# Patient Record
Sex: Female | Born: 1965 | Race: White | Hispanic: No | Marital: Married | Smoking: Never smoker
Health system: Southern US, Community
[De-identification: ages and names within clinical notes are randomized; demographics above are authoritative.]

## PROBLEM LIST (undated history)

## (undated) DIAGNOSIS — I82409 Acute embolism and thrombosis of unspecified deep veins of unspecified lower extremity: Secondary | ICD-10-CM

## (undated) DIAGNOSIS — C801 Malignant (primary) neoplasm, unspecified: Secondary | ICD-10-CM

## (undated) DIAGNOSIS — K449 Diaphragmatic hernia without obstruction or gangrene: Secondary | ICD-10-CM

## (undated) DIAGNOSIS — O2230 Deep phlebothrombosis in pregnancy, unspecified trimester: Secondary | ICD-10-CM

## (undated) DIAGNOSIS — T7840XA Allergy, unspecified, initial encounter: Secondary | ICD-10-CM

## (undated) DIAGNOSIS — D6851 Activated protein C resistance: Secondary | ICD-10-CM

## (undated) HISTORY — DX: Deep phlebothrombosis in pregnancy, unspecified trimester: O22.30

## (undated) HISTORY — DX: Allergy, unspecified, initial encounter: T78.40XA

## (undated) HISTORY — PX: VEIN SURGERY: SHX48

## (undated) HISTORY — PX: TYMPANOPLASTY: SHX33

## (undated) HISTORY — DX: Activated protein C resistance: D68.51

## (undated) HISTORY — DX: Malignant (primary) neoplasm, unspecified: C80.1

## (undated) HISTORY — PX: TONSILLECTOMY AND ADENOIDECTOMY: SUR1326

## (undated) HISTORY — DX: Acute embolism and thrombosis of unspecified deep veins of unspecified lower extremity: I82.409

## (undated) HISTORY — PX: SKIN BIOPSY: SHX1

## (undated) HISTORY — DX: Diaphragmatic hernia without obstruction or gangrene: K44.9

---

## 1994-11-04 DIAGNOSIS — O2230 Deep phlebothrombosis in pregnancy, unspecified trimester: Secondary | ICD-10-CM

## 1994-11-04 HISTORY — DX: Deep phlebothrombosis in pregnancy, unspecified trimester: O22.30

## 1998-08-07 ENCOUNTER — Other Ambulatory Visit: Admission: RE | Admit: 1998-08-07 | Discharge: 1998-08-07 | Payer: Self-pay | Admitting: Gynecology

## 2000-02-26 ENCOUNTER — Other Ambulatory Visit: Admission: RE | Admit: 2000-02-26 | Discharge: 2000-02-26 | Payer: Self-pay | Admitting: Gynecology

## 2000-05-02 ENCOUNTER — Other Ambulatory Visit: Admission: RE | Admit: 2000-05-02 | Discharge: 2000-05-02 | Payer: Self-pay | Admitting: Gynecology

## 2000-09-05 ENCOUNTER — Other Ambulatory Visit: Admission: RE | Admit: 2000-09-05 | Discharge: 2000-09-05 | Payer: Self-pay | Admitting: Gynecology

## 2001-08-17 ENCOUNTER — Other Ambulatory Visit: Admission: RE | Admit: 2001-08-17 | Discharge: 2001-08-17 | Payer: Self-pay | Admitting: Gynecology

## 2003-01-25 ENCOUNTER — Other Ambulatory Visit: Admission: RE | Admit: 2003-01-25 | Discharge: 2003-01-25 | Payer: Self-pay | Admitting: Gynecology

## 2004-06-05 ENCOUNTER — Other Ambulatory Visit: Admission: RE | Admit: 2004-06-05 | Discharge: 2004-06-05 | Payer: Self-pay | Admitting: Gynecology

## 2005-08-22 ENCOUNTER — Other Ambulatory Visit: Admission: RE | Admit: 2005-08-22 | Discharge: 2005-08-22 | Payer: Self-pay | Admitting: Gynecology

## 2007-05-01 ENCOUNTER — Other Ambulatory Visit: Admission: RE | Admit: 2007-05-01 | Discharge: 2007-05-01 | Payer: Self-pay | Admitting: Gynecology

## 2007-08-05 ENCOUNTER — Other Ambulatory Visit: Admission: RE | Admit: 2007-08-05 | Discharge: 2007-08-05 | Payer: Self-pay | Admitting: Gynecology

## 2007-11-05 DIAGNOSIS — I82409 Acute embolism and thrombosis of unspecified deep veins of unspecified lower extremity: Secondary | ICD-10-CM

## 2007-11-05 HISTORY — DX: Acute embolism and thrombosis of unspecified deep veins of unspecified lower extremity: I82.409

## 2008-06-07 ENCOUNTER — Other Ambulatory Visit: Admission: RE | Admit: 2008-06-07 | Discharge: 2008-06-07 | Payer: Self-pay | Admitting: Gynecology

## 2008-09-20 ENCOUNTER — Ambulatory Visit: Payer: Self-pay | Admitting: Hematology & Oncology

## 2008-09-26 LAB — PROTIME-INR (CHCC SATELLITE)
INR: 2.3 (ref 2.0–3.5)
Protime: 27.6 Seconds — ABNORMAL HIGH (ref 10.6–13.4)

## 2008-09-26 LAB — CBC WITH DIFFERENTIAL (CANCER CENTER ONLY)
BASO#: 0.1 10*3/uL (ref 0.0–0.2)
EOS%: 3.3 % (ref 0.0–7.0)
Eosinophils Absolute: 0.2 10*3/uL (ref 0.0–0.5)
HCT: 39.9 % (ref 34.8–46.6)
MCHC: 34.7 g/dL (ref 32.0–36.0)
MONO#: 0.5 10*3/uL (ref 0.1–0.9)
NEUT#: 4.1 10*3/uL (ref 1.5–6.5)
Platelets: 262 10*3/uL (ref 145–400)
RBC: 4.38 10*6/uL (ref 3.70–5.32)

## 2008-09-30 LAB — LUPUS ANTICOAGULANT PANEL
DRVVT: 49.7 secs — ABNORMAL HIGH (ref 36.1–47.0)
PTT Lupus Anticoagulant: 47.5 secs (ref 36.3–48.8)

## 2008-09-30 LAB — BETA-2 GLYCOPROTEIN ANTIBODIES
Beta-2-Glycoprotein I IgA: <4 U/mL (ref <10)
Beta-2-Glycoprotein I IgM: <4 U/mL (ref <10)

## 2008-09-30 LAB — CARDIOLIPIN ANTIBODIES, IGG, IGM, IGA: Anticardiolipin IgA: <7 [APL'U] (ref <13)

## 2008-11-21 ENCOUNTER — Encounter: Admission: RE | Admit: 2008-11-21 | Discharge: 2008-11-21 | Payer: Self-pay | Admitting: Gynecology

## 2008-12-22 ENCOUNTER — Ambulatory Visit: Payer: Self-pay | Admitting: Gynecology

## 2009-06-13 ENCOUNTER — Other Ambulatory Visit: Admission: RE | Admit: 2009-06-13 | Discharge: 2009-06-13 | Payer: Self-pay | Admitting: Gynecology

## 2009-06-13 ENCOUNTER — Ambulatory Visit: Payer: Self-pay | Admitting: Gynecology

## 2009-06-13 ENCOUNTER — Encounter: Payer: Self-pay | Admitting: Gynecology

## 2009-07-05 ENCOUNTER — Ambulatory Visit: Payer: Self-pay | Admitting: Gynecology

## 2009-07-19 ENCOUNTER — Ambulatory Visit: Payer: Self-pay | Admitting: Gynecology

## 2009-10-11 ENCOUNTER — Ambulatory Visit: Payer: Self-pay | Admitting: Gynecology

## 2009-11-27 ENCOUNTER — Encounter: Admission: RE | Admit: 2009-11-27 | Discharge: 2009-11-27 | Payer: Self-pay | Admitting: Gynecology

## 2009-12-18 ENCOUNTER — Ambulatory Visit: Payer: Self-pay | Admitting: Gynecology

## 2010-06-05 ENCOUNTER — Ambulatory Visit: Payer: Self-pay | Admitting: Hematology & Oncology

## 2010-06-06 LAB — CBC WITH DIFFERENTIAL (CANCER CENTER ONLY)
BASO#: 0 10*3/uL (ref 0.0–0.2)
Eosinophils Absolute: 0.1 10*3/uL (ref 0.0–0.5)
HGB: 13 g/dL (ref 11.6–15.9)
MONO#: 0.4 10*3/uL (ref 0.1–0.9)
NEUT#: 3.5 10*3/uL (ref 1.5–6.5)
WBC: 6.3 10*3/uL (ref 3.9–10.0)

## 2010-06-06 LAB — FERRITIN: Ferritin: 8 ng/mL — ABNORMAL LOW (ref 10–291)

## 2010-06-06 LAB — PROTIME-INR (CHCC SATELLITE): Protime: 38.4 Seconds — ABNORMAL HIGH (ref 10.6–13.4)

## 2010-06-18 ENCOUNTER — Ambulatory Visit: Payer: Self-pay | Admitting: Gynecology

## 2010-06-18 ENCOUNTER — Other Ambulatory Visit: Admission: RE | Admit: 2010-06-18 | Discharge: 2010-06-18 | Payer: Self-pay | Admitting: Gynecology

## 2010-06-19 ENCOUNTER — Ambulatory Visit: Payer: Self-pay | Admitting: Gynecology

## 2010-07-03 ENCOUNTER — Ambulatory Visit: Payer: Self-pay | Admitting: Gynecology

## 2010-11-28 ENCOUNTER — Encounter
Admission: RE | Admit: 2010-11-28 | Discharge: 2010-11-28 | Payer: Self-pay | Source: Home / Self Care | Attending: Gynecology | Admitting: Gynecology

## 2010-12-03 ENCOUNTER — Other Ambulatory Visit: Payer: Self-pay | Admitting: Gynecology

## 2010-12-03 DIAGNOSIS — R928 Other abnormal and inconclusive findings on diagnostic imaging of breast: Secondary | ICD-10-CM

## 2010-12-04 ENCOUNTER — Encounter
Admission: RE | Admit: 2010-12-04 | Discharge: 2010-12-04 | Payer: Self-pay | Source: Home / Self Care | Attending: Gynecology | Admitting: Gynecology

## 2011-06-24 ENCOUNTER — Encounter: Payer: Self-pay | Admitting: Gynecology

## 2011-07-01 ENCOUNTER — Ambulatory Visit (INDEPENDENT_AMBULATORY_CARE_PROVIDER_SITE_OTHER): Payer: BC Managed Care – PPO | Admitting: Gynecology

## 2011-07-01 ENCOUNTER — Other Ambulatory Visit (HOSPITAL_COMMUNITY)
Admission: RE | Admit: 2011-07-01 | Discharge: 2011-07-01 | Disposition: A | Discharge status: Home / Self Care | Payer: BC Managed Care – PPO | Source: Ambulatory Visit | Attending: Gynecology | Admitting: Gynecology

## 2011-07-01 ENCOUNTER — Encounter: Payer: Self-pay | Admitting: Gynecology

## 2011-07-01 VITALS — BP 110/70 | Ht 64.5 in | Wt 149.0 lb

## 2011-07-01 DIAGNOSIS — R1011 Right upper quadrant pain: Secondary | ICD-10-CM

## 2011-07-01 DIAGNOSIS — O2230 Deep phlebothrombosis in pregnancy, unspecified trimester: Secondary | ICD-10-CM | POA: Insufficient documentation

## 2011-07-01 DIAGNOSIS — Z01419 Encounter for gynecological examination (general) (routine) without abnormal findings: Secondary | ICD-10-CM

## 2011-07-01 DIAGNOSIS — Z131 Encounter for screening for diabetes mellitus: Secondary | ICD-10-CM

## 2011-07-01 DIAGNOSIS — I82409 Acute embolism and thrombosis of unspecified deep veins of unspecified lower extremity: Secondary | ICD-10-CM | POA: Insufficient documentation

## 2011-07-01 DIAGNOSIS — F411 Generalized anxiety disorder: Secondary | ICD-10-CM

## 2011-07-01 DIAGNOSIS — Z13220 Encounter for screening for lipoid disorders: Secondary | ICD-10-CM

## 2011-07-01 DIAGNOSIS — D6851 Activated protein C resistance: Secondary | ICD-10-CM | POA: Insufficient documentation

## 2011-07-01 DIAGNOSIS — N920 Excessive and frequent menstruation with regular cycle: Secondary | ICD-10-CM

## 2011-07-01 DIAGNOSIS — F419 Anxiety disorder, unspecified: Secondary | ICD-10-CM

## 2011-07-01 LAB — COMPREHENSIVE METABOLIC PANEL
ALT: 10 U/L (ref 0–35)
Albumin: 4.6 g/dL (ref 3.5–5.2)
Alkaline Phosphatase: 26 U/L — ABNORMAL LOW (ref 39–117)
CO2: 23 mEq/L (ref 19–32)
Total Protein: 6.7 g/dL (ref 6.0–8.3)

## 2011-07-01 MED ORDER — BUSPIRONE HCL 15 MG PO TABS: 15.0000 mg | ORAL_TABLET | Freq: Two times a day (BID) | ORAL | Status: DC

## 2011-07-01 NOTE — Progress Notes (Signed)
Nancy Simon May 05, 1966 [id number]        45 y.o.  for annual exam.  Notes of the last several days mid epigastric to right upper quadrant discomfort. Started following a meal this persisted on and off course after breakfast this morning. No nausea or vomiting fevers or chills or other constitutional symptoms no history of this previously. Periods continue to be heavy with her evaluated her in the past for this to include a negative sonohysterogram negative endometrial biopsy she has elected not to do anything with this. She does note that her. Have gotten better since stopping the Coumadin but they still are fairly heavy. They are regular. She had previously had a marginal FSH but then on repeat it was normal. Is on BuSpar for anxiety and doing well with this and wants to have this refilled.  Past medical history,surgical history, allergies, family history and social history were all reviewed and documented in the EPIC chart. ROS:  Was performed and pertinent positives and negatives are included in the history.  Exam: chaperone present Filed Vitals:   07/01/11 1544  BP: 110/70   General appearance  Normal Skin grossly normal Head/Neck normal with no cervical or supraclavicular adenopathy thyroid normal Lungs  clear Cardiac RR, without RMG Abdominal  Soft,mild tenderness right upper quadrant, without masses, organomegaly or hernia Breasts  examined lying and sitting without masses, retractions, discharge or axillary adenopathy. Pelvic  Ext/BUS/vagina  normal   Cervix  normal  Pap done  Uterus  anteverted, normal size, shape and contour, midline and mobile nontender   Adnexa  Without masses or tenderness    Anus and perineum  normal   Rectovaginal  normal sphincter tone without palpated masses or tenderness.    Assessment/Plan:  45 y.o. female for annual exam.    #1 Right upper quadrant tenderness. Discussed with patient suspicious for cholelithiasis. We'll start with right upper  quadrant ultrasound and go from there. Also check baseline CBC and comprehensive metabolic panel. She is no overt signs or symptoms to suggest cholecystitis. If ultrasound shows stones were referred to Gen. Surgery. Ultrasound is normal pain resolves we'll watch if pain persists will refer to gastroenterology. #2 Anxiety.  Does well with BuSpar 15 mg twice a day. Will refill x1 year. #3 Health maintenance. Self breast exams on a monthly basis discussed urged. Had mammography in January with followup ultrasound showed a small simple cyst. We'll continue with annual mammography as recommended by radiology.  Baseline CBC urinalysis lipid profile and comprehensive metabolic panel.    Dara Lords MD, 5:07 PM 07/01/2011

## 2011-07-03 ENCOUNTER — Telehealth: Payer: Self-pay | Admitting: *Deleted

## 2011-07-03 NOTE — Telephone Encounter (Signed)
WRONG PATIENT

## 2011-07-05 ENCOUNTER — Telehealth: Payer: Self-pay | Admitting: Gynecology

## 2011-07-05 ENCOUNTER — Ambulatory Visit (HOSPITAL_COMMUNITY)
Admission: RE | Admit: 2011-07-05 | Discharge: 2011-07-05 | Disposition: A | Discharge status: Home / Self Care | Payer: BC Managed Care – PPO | Source: Ambulatory Visit | Attending: Gynecology | Admitting: Gynecology

## 2011-07-05 DIAGNOSIS — R110 Nausea: Secondary | ICD-10-CM | POA: Insufficient documentation

## 2011-07-05 DIAGNOSIS — R1011 Right upper quadrant pain: Secondary | ICD-10-CM

## 2011-07-05 DIAGNOSIS — K824 Cholesterolosis of gallbladder: Secondary | ICD-10-CM

## 2011-07-05 NOTE — Telephone Encounter (Signed)
Tell patient's ultrasound shows a polyp in the gallbladder and I want her to see a general surgeon to talk about options. This does not necessarily mean that she needs to have surgery I want her to hear it rectae from the general surgeon as to what they think is the best course of action. I put her in for an ambulatory referral to general surgery

## 2011-07-05 NOTE — Telephone Encounter (Signed)
PT INFORMED WITH THE BELOW NOTE AND I TOLD PATIENT THAT SHE WILL HER FROM [facility] REGARDING APPOINTMENT INFORMATION WITH THE GENERAL SURGERY.

## 2011-07-11 ENCOUNTER — Encounter (INDEPENDENT_AMBULATORY_CARE_PROVIDER_SITE_OTHER): Payer: Self-pay | Admitting: Surgery

## 2011-07-11 ENCOUNTER — Ambulatory Visit (INDEPENDENT_AMBULATORY_CARE_PROVIDER_SITE_OTHER): Payer: BC Managed Care – PPO | Admitting: Surgery

## 2011-07-11 VITALS — BP 102/62 | HR 60 | Temp 97.1°F | Ht 65.0 in | Wt 146.6 lb

## 2011-07-11 DIAGNOSIS — R1011 Right upper quadrant pain: Secondary | ICD-10-CM

## 2011-07-11 NOTE — Progress Notes (Addendum)
ASSESSMENT AND PLAN: 1.  Gall bladder polyp.  2.  Abdominal pain.  She certainly behaves as if she has GB disease.  She is on chronic aspirin, so ulcer disease could be another possiblity.  I spent 20+ minutes going over the options of surgery vs HIDA scan vs GI consult.   [HIDA scan neg.  To have her see GI consult.  Discussed by phone.  DN 07/19/11]  I gave her a copy of her [place] report and literature on GB surgery.  She wants to get a HIDA scan first, then make decisions.  3.  Anxiety.  4.  Factor V Leiden deficiency.  On chronic aspirin.  Has seen Dr. Ross Marcus.  Chief Complaint  Patient presents with  . Other    Eval RUQ pain, gb u/s shows post murphys sign    HISTORY OF PRESENT ILLNESS: Nancy Simon is a 45 y.o. (DOB: [birth year])  white female who is a patient of HAMRICK,MAURA L, MD and comes to me today for abdominal pain, gall bladder polyp.  The patient developed abdominal pain Sunday, 30 June 2011. She had some vague, chronic abdominal discomfort, but nothing acute like this pain. She happened to see Dr. Chiquita Loth on 27 August, 2012. He ordered an abdominal ultrasound which was done on 31 August, 2012. The abdominal ultrasound showed a small gallbladder polyp, but no stones.  The pain has been constant, though she can sleep.  She has had nausea, but no vomiting.  From a stress standpoint, her mother has been in the hospital with CHF.  The patient has a remote history of upper endoscopy about 20 years ago. She has no history of peptic ulcer disease, liver disease, pancreatic disease, or colon disease. She's never had a colonoscopy. Her mother had colon cancer diagnosed in her late 48s. The patient is planning to get her initial colonoscopy at age 70.  Past Medical History  Diagnosis Date  . DVT (deep vein thrombosis) in pregnancy 1996    right leg  . DVT of leg (deep venous thrombosis) 2009    left leg  . Factor 5 Leiden mutation, heterozygous   . Abdominal pain   .  Nausea     Past Surgical History  Procedure Date  . Tonsillectomy and adenoidectomy   . Inner ear surgery     ear drum ruptured    Current Outpatient Prescriptions  Medication Sig Dispense Refill  . aspirin 325 MG tablet Take 325 mg by mouth daily.        . busPIRone (BUSPAR) 15 MG tablet Take 1 tablet (15 mg total) by mouth 2 (two) times daily.  60 tablet  11  . IRON PO Take by mouth.          Allergies  Allergen Reactions  . Flagyl (Metronidazole Hcl)     Can't take with coumadin.   REVIEW OF SYSTEMS: Skin:  No history of rash.  No history of abnormal moles. Infection:  No history of hepatitis or HIV.  No history of MRSA. Neurologic:  No history of stroke.  No history of seizure.  No history of headaches. Cardiac:  No history of hypertension. No history of heart disease.  No history of prior cardiac catheterization.  No history of seeing a cardiologist. Pulmonary:  Does not smoke cigarettes.  No asthma or bronchitis.  No OSA/CPAP.  Endocrine:  No diabetes. Benign thyroid nodule seen on CT of chest. Gastrointestinal:  See HPI. Urologic:  No history of kidney stones.  No history of bladder infections. Musculoskeletal:  No history of joint or back disease. Hematologic:  HIstory of Factor V Leiden deficiency.  Has seen Dr. Myna Hidalgo. Now on full aspirin as prevention.  Last DVT was 2009.  SOCIAL and FAMILY HISTORY: Accompanied with husband, Tim. Works for [employer].  Has to do with schools.  PHYSICAL EXAM: BP 102/62  Pulse 60  Temp(Src) 97.1 F (36.2 C) (Temporal)  Ht 5\' 5"  (1.651 m)  Wt 146 lb 9.6 oz (66.497 kg)  BMI 24.40 kg/m2  LMP 06/12/2011  HEENT: Normal. Pupils equal. Normal dentition. Neck: Supple. No thyroid mass.  I do not feel the nodule. Lymph Nodes:  No supraclavicular or cervical nodes. Lungs: Clear and symmetric. Heart:  RRR. No murmur. Abdomen: No mass. Sore RUQ. No hernia. Normal bowel sounds.  No abdominal scars. Rectal: Not  done. Extremities:  Good strength in upper and lower extremities. Neurologic:  Grossly intact to motor and sensory function.   DATA REVIEWED: Dr. Reynold Bowen notes, labs, and [place] report.

## 2011-07-11 NOTE — Progress Notes (Signed)
Addended byIvory Broad on: 07/11/2011 04:00 PM   Modules accepted: Orders

## 2011-07-11 NOTE — Patient Instructions (Signed)
Plan HIDA scan to evaluate GB.

## 2011-07-15 ENCOUNTER — Encounter (HOSPITAL_COMMUNITY)
Admission: RE | Admit: 2011-07-15 | Discharge: 2011-07-15 | Disposition: A | Discharge status: Home / Self Care | Payer: BC Managed Care – PPO | Source: Ambulatory Visit | Attending: Surgery | Admitting: Surgery

## 2011-07-15 DIAGNOSIS — R109 Unspecified abdominal pain: Secondary | ICD-10-CM | POA: Insufficient documentation

## 2011-07-15 MED ORDER — TECHNETIUM TC 99M MEBROFENIN IV KIT
5.0000 | PACK | Freq: Once | INTRAVENOUS | Status: AC | PRN
  Administered 2011-07-15: 5 via INTRAVENOUS

## 2011-07-16 ENCOUNTER — Telehealth (INDEPENDENT_AMBULATORY_CARE_PROVIDER_SITE_OTHER): Payer: Self-pay

## 2011-07-16 NOTE — Telephone Encounter (Signed)
I called pt to notify her of her HIDA results after she left me a voicemail.  I told her the results were normal and I need to discuss this with Dr Ezzard Standing to see what her next step should be.  The gb function was 66.8%.   She states she is still not feeling well and thinks something is wrong.

## 2011-07-19 ENCOUNTER — Encounter: Payer: Self-pay | Admitting: Gastroenterology

## 2011-07-19 ENCOUNTER — Other Ambulatory Visit (INDEPENDENT_AMBULATORY_CARE_PROVIDER_SITE_OTHER): Payer: Self-pay | Admitting: Surgery

## 2011-07-19 NOTE — Telephone Encounter (Signed)
Error

## 2011-07-22 ENCOUNTER — Encounter: Payer: Self-pay | Admitting: Internal Medicine

## 2011-07-26 ENCOUNTER — Ambulatory Visit (INDEPENDENT_AMBULATORY_CARE_PROVIDER_SITE_OTHER): Payer: BC Managed Care – PPO | Admitting: Internal Medicine

## 2011-07-26 ENCOUNTER — Encounter: Payer: Self-pay | Admitting: Internal Medicine

## 2011-07-26 VITALS — BP 118/72 | HR 64 | Ht 65.0 in | Wt 147.0 lb

## 2011-07-26 DIAGNOSIS — R1011 Right upper quadrant pain: Secondary | ICD-10-CM

## 2011-07-26 DIAGNOSIS — Z800 Family history of malignant neoplasm of digestive organs: Secondary | ICD-10-CM

## 2011-07-26 NOTE — Progress Notes (Signed)
Subjective:    Patient ID: Nancy Simon, female    DOB: Nov 16, 1965, 45 y.o.   MRN: [id number]  HPI Nancy Simon is a 45 year old female with a past medical history of factor V Leiden and DVT who seen in consultation at the request of Dr. Ezzard Standing for evaluation of right quadrant pain.  The patient reports to a half weeks of severe right upper quadrant pain. This pain was constant and nonradiating. It was significantly worse after eating. She also reports abdominal pressure in the epigastrium. Initially she felt this pain was muscular but it increased in intensity and became constant prompting her to seek care. She had associated nausea without vomiting. Prior to the onset of the pain she never had similar pain. No specific alleviating factor.  She does report the pain has been gone for the last 8 days. She denies fevers chills or night sweats. She has intermittent heartburn but this is rare. She denies dysphagia and odynophagia. She reports chronic issues with constipation but no change in bowel habits. No rectal bleeding or melena.  The patient was evaluated for her right upper quadrant pain by general surgery and has undergone right upper quadrant ultrasound and HIDA scan.   Review of Systems Constitutional: Negative for fever, chills, night sweats, activity change, appetite change and unexpected weight change HEENT: Negative for sore throat, mouth sores and trouble swallowing. Eyes: Negative for visual disturbance Respiratory: Negative for cough, chest tightness and shortness of breath Cardiovascular: Negative for chest pain, palpitations and lower extremity swelling Gastrointestinal: See history of present illness Genitourinary: Negative for dysuria and hematuria. Musculoskeletal: Positive for back pain, no arthralgias or myalgias Skin: Negative for rash or color change Neurological: Negative for headaches, weakness, numbness Hematological: Negative for adenopathy, negative for easy  bruising/bleeding Psychiatric/behavioral: Negative for depressed mood, negative for anxiety   Past Medical History  Diagnosis Date  . DVT (deep vein thrombosis) in pregnancy 1996    right leg  . DVT of leg (deep venous thrombosis) 2009    left leg  . Factor 5 Leiden mutation, heterozygous   . Clotting disorder    Current Outpatient Prescriptions  Medication Sig Dispense Refill  . aspirin 325 MG tablet Take 325 mg by mouth daily.        . busPIRone (BUSPAR) 15 MG tablet Take 15 mg by mouth at bedtime.        . fexofenadine (ALLEGRA) 60 MG tablet Take 60 mg by mouth daily.         Allergies  Allergen Reactions  . Flagyl (Metronidazole Hcl)     Can't take with coumadin.   Family History  Problem Relation Age of Onset  . Colon cancer Mother   . Diabetes Mother   . Hypertension Mother   . Heart disease Mother     CHF  . Heart disease Father     afib  . Clotting disorder Father   --mother with colorectal cancer at age 42.     Objective:   Physical Exam BP 118/72  Pulse 64  Ht 5\' 5"  (1.651 m)  Wt 147 lb (66.679 kg)  BMI 24.46 kg/m2  LMP 07/10/2011 Constitutional: Well-developed and well-nourished. No distress. HEENT: Normocephalic and atraumatic. Oropharynx is clear and moist. No oropharyngeal exudate. Conjunctivae are normal. Pupils are equal round and reactive to light. No scleral icterus. Neck: Neck supple. Trachea midline. Cardiovascular: Normal rate, regular rhythm and intact distal pulses. No M/R/G Pulmonary/chest: Effort normal and breath sounds normal. No wheezing, rales or  rhonchi. Abdominal: Soft, very mild right upper quadrant tenderness just below the right costal margin, nondistended. Bowel sounds active throughout. There are no masses palpable. No hepatosplenomegaly. Lymphadenopathy: No cervical adenopathy noted. Neurological: Alert and oriented to person place and time. Skin: Mild cyanosis noted at the fingertips bilaterally, otherwise skin is warm and  dry. No rashes noted. Psychiatric: Normal mood and affect. Behavior is normal.  CMP     Component Value Date/Time   NA 139 07/01/2011 1626   K 4.2 07/01/2011 1626   CL 104 07/01/2011 1626   CO2 23 07/01/2011 1626   GLUCOSE 88 07/01/2011 1626   BUN 10 07/01/2011 1626   CREATININE 0.66 07/01/2011 1626   CALCIUM 9.9 07/01/2011 1626   PROT 6.7 07/01/2011 1626   ALBUMIN 4.6 07/01/2011 1626   AST 14 07/01/2011 1626   ALT 10 07/01/2011 1626   ALKPHOS 26* 07/01/2011 1626   BILITOT 0.6 07/01/2011 1626   CBC    Component Value Date/Time   WBC 6.3 06/06/2010 1133   HGB 13.0 06/06/2010 1133   HCT 36.8 06/06/2010 1133   PLT 287 06/06/2010 1133   MCV 90 06/06/2010 1133   EOSABS 0.1 06/06/2010 1133   BASOSABS 0.0 06/06/2010 1133     RUQ [place] 07/01/11: IMPRESSION:  Small focal gallbladder polyp. A positive sonographic Murphy's  sign was present, however no other sonographic features suggestive  of acute or chronic cholecystitis are seen.  HIDA 07/11/11: IMPRESSION:  1. Normal biliary patency study and normal gallbladder ejection  fraction.  2. And the patient was mildly symptomatic during infusion of CCK.    Assessment & Plan:  45 yo female with history of factor V Leiden and DVT currently on full-strength aspirin presenting with now improved right upper quadrant pain and epigastric pressure.  1. RUQ pain -- the pain is suspicious for a biliary origin, but has now improved significantly. Certainly this could represent acid peptic disease such as ulcer or gastritis/duodenitis.  The patient is somewhat hesitant to proceed with surgery without first being more sure of the diagnosis. Upper endoscopy is very reasonable given her symptoms and we will proceed with this test. I would like her to continue her full-strength aspirin during and through the endoscopy.  Further recommendations can be made based on endoscopy findings.  2. Family h/o CRC -- currently no symptoms to warrant examination now. The patient's mother  was diagnosed with colorectal cancer at age 4. Based on NCCN and other guidelines, she is considered to have an increased risk. In her case, guidelines recommend beginning screening colonoscopy at age 36.  This will be next year in August. We will place a recall colonoscopy for this time.

## 2011-07-26 NOTE — Telephone Encounter (Signed)
Dr Ezzard Standing spoke to the pt.  We ordered a GI consult

## 2011-07-26 NOTE — Patient Instructions (Signed)
You have been scheduled for an upper endoscopy- please see separate instructions Please remain on aspirin daily You are due for a colonoscopy in September of 2013.  You will receive a letter at that time.

## 2011-08-02 ENCOUNTER — Encounter: Payer: Self-pay | Admitting: Internal Medicine

## 2011-08-02 ENCOUNTER — Ambulatory Visit (AMBULATORY_SURGERY_CENTER): Payer: BC Managed Care – PPO | Admitting: Internal Medicine

## 2011-08-02 DIAGNOSIS — R109 Unspecified abdominal pain: Secondary | ICD-10-CM

## 2011-08-02 DIAGNOSIS — K219 Gastro-esophageal reflux disease without esophagitis: Secondary | ICD-10-CM

## 2011-08-02 DIAGNOSIS — K2970 Gastritis, unspecified, without bleeding: Secondary | ICD-10-CM

## 2011-08-02 DIAGNOSIS — K2990 Gastroduodenitis, unspecified, without bleeding: Secondary | ICD-10-CM

## 2011-08-02 MED ORDER — SODIUM CHLORIDE 0.9 % IV SOLN: 500.0000 mL | INTRAVENOUS | Status: DC

## 2011-08-02 NOTE — Patient Instructions (Signed)
PLEASE FOLLOW DISCHARGE INSTRUCTION GIVEN TODAY. SEE HANDOUTS ALSO. RESUME MEDICATIONS TODAY. GASTRITIS AND SMALL HIATAL HERNIA SEEN, BIOPSIES TAKEN TO RULE OUT BACTERIA CALLED H.PYLORI. YOU WILL RECEIVE A LETTER IN YOUR MAIL OR CALL FROM YOUR DOCTOR FOR RESULTS OF BIOPSIES. CALL us FOR ANY QUESTIONS OR CONCERNS. THANK YOU!

## 2011-08-05 ENCOUNTER — Telehealth: Payer: Self-pay

## 2011-08-05 NOTE — Telephone Encounter (Signed)
Follow up Call- Patient questions:  Do you have a fever, pain , or abdominal swelling? no Pain Score  0 *  Have you tolerated food without any problems? yes  Have you been able to return to your normal activities? yes  Do you have any questions about your discharge instructions: Diet   no Medications  no Follow up visit  no  Do you have questions or concerns about your Care? no  Actions: * If pain score is 4 or above: No action needed, pain <4. Per the pt I did fine from the procedure but, I'm no better from my sx.  I told her that bx were taken and we needed to see what the results are and Dr. Rhea Belton would make recommendations from the path results.  She said she understood. MAW

## 2011-08-08 ENCOUNTER — Ambulatory Visit (INDEPENDENT_AMBULATORY_CARE_PROVIDER_SITE_OTHER): Payer: BC Managed Care – PPO | Admitting: Women's Health

## 2011-08-08 ENCOUNTER — Ambulatory Visit (INDEPENDENT_AMBULATORY_CARE_PROVIDER_SITE_OTHER): Payer: BC Managed Care – PPO | Admitting: Surgery

## 2011-08-08 ENCOUNTER — Encounter: Payer: Self-pay | Admitting: Internal Medicine

## 2011-08-08 ENCOUNTER — Encounter: Payer: Self-pay | Admitting: Women's Health

## 2011-08-08 VITALS — BP 130/70

## 2011-08-08 DIAGNOSIS — N898 Other specified noninflammatory disorders of vagina: Secondary | ICD-10-CM

## 2011-08-08 DIAGNOSIS — L293 Anogenital pruritus, unspecified: Secondary | ICD-10-CM

## 2011-08-08 DIAGNOSIS — B373 Candidiasis of vulva and vagina: Secondary | ICD-10-CM

## 2011-08-08 MED ORDER — TERCONAZOLE 0.4 % VA CREA: 1.0000 | TOPICAL_CREAM | Freq: Every day | VAGINAL | Status: AC

## 2011-08-08 NOTE — Progress Notes (Signed)
  Presents with a complaint of vaginal discharge and itching, no relief with over-the-counter Monistat x2. Monthly cycle, she is currently off Coumadin and on aspirin daily, history of DVTs  and factor V Leiden. Denies any urinary symptoms.  Exam: External genitalia is erythematous and introitus, speculum exam, vaginal walls are slightly erythematous, scant white discharge. Wet prep is positive for yeast.  Plan: Terazol 7 one applicator at bedtime x7 prescription proper use was given will call if no relief of symptoms. Yeast prevention discussed.

## 2011-08-15 ENCOUNTER — Telehealth: Payer: Self-pay | Admitting: Internal Medicine

## 2011-08-15 NOTE — Telephone Encounter (Signed)
Pt with hx of Factor V Lieden and DVT currently on ASA therapy seen 07/26/11 for RUQ pain that had resolved at visit. Pt admits this was probably d/t her diet. Pt had an EGD on 08/02/11 showing mild gastritis. Her CCK HIDA showed small focal Gall Bladder polyp, no other disease. Pt reports since her EGD, her original problem is back, especially after acidic foods. She reports indigestion and feels as though something is pushing up into her esophagus. Her husband has script for Omeprazole which she took with improvement. Please advise. Thanks.

## 2011-08-16 MED ORDER — OMEPRAZOLE 20 MG PO CPDR: DELAYED_RELEASE_CAPSULE | ORAL | Status: DC

## 2011-08-16 NOTE — Telephone Encounter (Signed)
Start omeprazole 20 mg daily (30 min before 1st meal of the day) Had gastritis felt related to daily asa. Call if not better or worse.

## 2011-08-16 NOTE — Telephone Encounter (Signed)
Informed pt about Dr Lauro Franklin recommendations. Pt stated understanding requesting I order meds at [facility]- done.

## 2011-08-22 ENCOUNTER — Telehealth: Payer: Self-pay | Admitting: Internal Medicine

## 2011-08-22 MED ORDER — OMEPRAZOLE 20 MG PO CPDR: DELAYED_RELEASE_CAPSULE | ORAL | Status: DC

## 2011-08-22 NOTE — Telephone Encounter (Signed)
Resent order for Omeprazole but [facility] had already filled it.

## 2011-11-05 DIAGNOSIS — I82409 Acute embolism and thrombosis of unspecified deep veins of unspecified lower extremity: Secondary | ICD-10-CM

## 2011-11-05 HISTORY — DX: Acute embolism and thrombosis of unspecified deep veins of unspecified lower extremity: I82.409

## 2011-11-06 ENCOUNTER — Other Ambulatory Visit: Payer: Self-pay | Admitting: Gynecology

## 2011-11-06 DIAGNOSIS — Z139 Encounter for screening, unspecified: Secondary | ICD-10-CM

## 2011-11-16 ENCOUNTER — Ambulatory Visit (HOSPITAL_COMMUNITY)
Admission: RE | Admit: 2011-11-16 | Discharge: 2011-11-16 | Disposition: A | Discharge status: Home / Self Care | Payer: BC Managed Care – PPO | Source: Ambulatory Visit | Attending: Family Medicine | Admitting: Family Medicine

## 2011-11-16 ENCOUNTER — Ambulatory Visit (INDEPENDENT_AMBULATORY_CARE_PROVIDER_SITE_OTHER): Payer: BC Managed Care – PPO

## 2011-11-16 DIAGNOSIS — R52 Pain, unspecified: Secondary | ICD-10-CM

## 2011-11-16 DIAGNOSIS — M79609 Pain in unspecified limb: Secondary | ICD-10-CM

## 2011-12-09 ENCOUNTER — Ambulatory Visit: Payer: BC Managed Care – PPO

## 2011-12-23 ENCOUNTER — Ambulatory Visit
Admission: RE | Admit: 2011-12-23 | Discharge: 2011-12-23 | Disposition: A | Discharge status: Home / Self Care | Payer: BC Managed Care – PPO | Source: Ambulatory Visit | Attending: Gynecology | Admitting: Gynecology

## 2011-12-23 DIAGNOSIS — Z139 Encounter for screening, unspecified: Secondary | ICD-10-CM

## 2012-03-18 ENCOUNTER — Other Ambulatory Visit: Payer: Self-pay | Admitting: Gastroenterology

## 2012-07-28 ENCOUNTER — Encounter: Payer: Self-pay | Admitting: Gastroenterology

## 2012-07-30 ENCOUNTER — Encounter: Payer: Self-pay | Admitting: Internal Medicine

## 2012-09-06 ENCOUNTER — Other Ambulatory Visit: Payer: Self-pay | Admitting: Internal Medicine

## 2012-12-01 ENCOUNTER — Other Ambulatory Visit: Payer: Self-pay | Admitting: Gynecology

## 2012-12-01 DIAGNOSIS — Z1231 Encounter for screening mammogram for malignant neoplasm of breast: Secondary | ICD-10-CM

## 2012-12-24 ENCOUNTER — Ambulatory Visit: Payer: BC Managed Care – PPO

## 2012-12-30 ENCOUNTER — Ambulatory Visit: Payer: BC Managed Care – PPO

## 2013-01-04 ENCOUNTER — Ambulatory Visit: Payer: BC Managed Care – PPO

## 2013-01-05 ENCOUNTER — Ambulatory Visit
Admission: RE | Admit: 2013-01-05 | Discharge: 2013-01-05 | Disposition: A | Discharge status: Home / Self Care | Payer: BC Managed Care – PPO | Source: Ambulatory Visit | Attending: Gynecology | Admitting: Gynecology

## 2013-01-05 DIAGNOSIS — Z1231 Encounter for screening mammogram for malignant neoplasm of breast: Secondary | ICD-10-CM

## 2013-01-11 ENCOUNTER — Ambulatory Visit (INDEPENDENT_AMBULATORY_CARE_PROVIDER_SITE_OTHER): Payer: BC Managed Care – PPO | Admitting: Gynecology

## 2013-01-11 ENCOUNTER — Encounter: Payer: Self-pay | Admitting: Gynecology

## 2013-01-11 ENCOUNTER — Telehealth: Payer: Self-pay | Admitting: *Deleted

## 2013-01-11 VITALS — BP 116/74 | Ht 65.0 in | Wt 150.0 lb

## 2013-01-11 DIAGNOSIS — N6009 Solitary cyst of unspecified breast: Secondary | ICD-10-CM

## 2013-01-11 DIAGNOSIS — N6001 Solitary cyst of right breast: Secondary | ICD-10-CM

## 2013-01-11 DIAGNOSIS — Z01419 Encounter for gynecological examination (general) (routine) without abnormal findings: Secondary | ICD-10-CM

## 2013-01-11 NOTE — Telephone Encounter (Signed)
Order placed for the below 

## 2013-01-11 NOTE — Telephone Encounter (Signed)
Message copied by Aura Camps on Mon Jan 11, 2013  3:44 PM ------      Message from: Dara Lords      Created: Mon Jan 11, 2013  3:36 PM       Schedule diagnostic mammography and ultrasound right breast reference history of cyst now tender in the same area. ------

## 2013-01-11 NOTE — Progress Notes (Signed)
Nancy Simon 03/07/1966 [id number]        47 y.o.  G2P2002 for annual exam.  Several issues noted below.  Past medical history,surgical history, medications, allergies, family history and social history were all reviewed and documented in the EPIC chart. ROS:  Was performed and pertinent positives and negatives are included in the history.  Exam: Sherrilyn Rist assistant Filed Vitals:   01/11/13 1355  BP: 116/74  Height: 5\' 5"  (1.651 m)  Weight: 150 lb (68.04 kg)   General appearance  Normal Skin grossly normal Head/Neck normal with no cervical or supraclavicular adenopathy thyroid normal Lungs  clear Cardiac RR, without RMG Abdominal  soft, nontender, without masses, organomegaly or hernia Breasts  examined lying and sitting without masses, retractions, discharge or axillary adenopathy. Pelvic  Ext/BUS/vagina  normal   Cervix  normal   Uterus  anteverted, normal size, shape and contour, midline and mobile nontender   Adnexa  Without masses or tenderness    Anus and perineum  normal   Rectovaginal  normal sphincter tone without palpated masses or tenderness.    Assessment/Plan:  47 y.o. G15P2002 female for annual exam, regular menses, vasectomy birth control.   1. History of right breast cyst.  Upper outer quadrant. Patient notes some tenderness most recently in this area when she went for her mammogram and she told them this and they did not do the mammogram. We'll plan diagnostic mammogram with ultrasound. As long as normal then we'll follow.  If she palpates any masses or persistent tenderness she knows to represent for evaluation. 2. Menorrhagia. Had another DVT in the left extremity this past fall.  Currently on Rivaroxaban. Her periods are heavier now. Option for manage reviewed including Mirena IUD and  endometrial ablation. Patient does not want to do anything at this point. No intermenstrual bleeding or other abnormalities. 3. Pap smear 2012. No Pap smear done today. No history of  significant abnormal Pap smears. Plan repeat next year a 3 year interval. 4. Health maintenance.  Has upcoming exam with her primary scheduled and  she will  do blood work at her office. I asked her make sure they check a CBC given her bleeding history to make sure that she is not anemic and she agrees with this.  otherwise follow up in one year, sooner as needed.    Dara Lords MD, 2:25 PM 01/11/2013

## 2013-01-11 NOTE — Patient Instructions (Addendum)
Office will contact you to arrange for mammogram and ultrasound of the right breast. Followup in one year for annual gynecologic exam.

## 2013-01-15 ENCOUNTER — Encounter: Payer: Self-pay | Admitting: Gynecology

## 2013-01-15 NOTE — Telephone Encounter (Signed)
Appt. 01/25/13 @ 2:00 pm

## 2013-01-25 ENCOUNTER — Ambulatory Visit
Admission: RE | Admit: 2013-01-25 | Discharge: 2013-01-25 | Disposition: A | Discharge status: Home / Self Care | Payer: BC Managed Care – PPO | Source: Ambulatory Visit | Attending: Gynecology | Admitting: Gynecology

## 2013-01-25 DIAGNOSIS — N6001 Solitary cyst of right breast: Secondary | ICD-10-CM

## 2013-02-17 ENCOUNTER — Encounter: Payer: Self-pay | Admitting: Internal Medicine

## 2013-05-03 ENCOUNTER — Other Ambulatory Visit: Payer: Self-pay | Admitting: *Deleted

## 2013-05-03 ENCOUNTER — Ambulatory Visit (INDEPENDENT_AMBULATORY_CARE_PROVIDER_SITE_OTHER): Payer: BC Managed Care – PPO | Admitting: Physician Assistant

## 2013-05-03 ENCOUNTER — Encounter: Payer: Self-pay | Admitting: Internal Medicine

## 2013-05-03 ENCOUNTER — Encounter: Payer: Self-pay | Admitting: Physician Assistant

## 2013-05-03 VITALS — BP 98/60 | HR 68 | Ht 65.0 in | Wt 149.2 lb

## 2013-05-03 DIAGNOSIS — Z800 Family history of malignant neoplasm of digestive organs: Secondary | ICD-10-CM

## 2013-05-03 DIAGNOSIS — Z7901 Long term (current) use of anticoagulants: Secondary | ICD-10-CM

## 2013-05-03 MED ORDER — MOVIPREP 100 G PO SOLR: 1.0000 | Freq: Once | ORAL | Status: DC

## 2013-05-03 NOTE — Patient Instructions (Addendum)
We faxed a prescription to [facility] for the Moviprep. We will contact Dr. Arlan Organ about the Xarelto medication. You have been scheduled for a colonoscopy with propofol. Please follow written instructions given to you at your visit today.  Please pick up your prep kit at the pharmacy within the next 1-3 days. If you use inhalers (even only as needed), please bring them with you on the day of your procedure. Your physician has requested that you go to www.[email or web address].[email or web address] and enter the access code given to you at your visit today. This web site gives a general overview about your procedure. However, you should still follow specific instructions given to you by our office regarding your preparation for the procedure.

## 2013-05-03 NOTE — Progress Notes (Addendum)
Subjective:    Patient ID: Nancy Simon, female    DOB: 1966-05-27, 47 y.o.   MRN: [id number]  HPI  Nancy Simon is a pleasant 47 year old white female known to Dr. Rhea Simon. She had undergone evaluation in 2012 for epigastric pain and underwent upper endoscopy with finding of gastritis. This may have been aspirin related , as she has had no difficulty since being switched to Xarelto . She does have family history of colon cancer in her mother who was diagnosed at age 69 and she has been recommended to have colonoscopy at 59.Marland Kitchen She has no current GI symptoms . She has history of recurrent DVTs x3, with her most recent was in August of 2013 which occurred while on full-strength aspirin and she was therefore placed on  Xarelto  She has a known factor V Lieden   mutation. She had been seen in the past by Dr. Myna Simon  for hematology and is currently managed by her  PCP.    Review of Systems  Constitutional: Negative.   HENT: Negative.   Eyes: Negative.   Respiratory: Negative.   Cardiovascular: Negative.   Gastrointestinal: Negative.   Endocrine: Negative.   Genitourinary: Negative.   Musculoskeletal: Negative.   Skin: Negative.   Allergic/Immunologic: Negative.   Neurological: Negative.   Hematological: Negative.   Psychiatric/Behavioral: Negative.    Outpatient Prescriptions Prior to Visit  Medication Sig Dispense Refill  . busPIRone (BUSPAR) 15 MG tablet Take 15 mg by mouth 3 (three) times daily.      . fexofenadine (ALLEGRA) 60 MG tablet Take 60 mg by mouth daily.        . Rivaroxaban (XARELTO PO) Take by mouth.       No facility-administered medications prior to visit.       Allergies  Allergen Reactions  . Flagyl (Metronidazole Hcl)     Can't take with coumadin.   Patient Active Problem List   Diagnosis Date Noted  . Family hx of colon cancer 05/03/2013  . Factor 5 Leiden mutation, heterozygous   . DVT (deep vein thrombosis) in pregnancy    History  Substance Use Topics  .  Smoking status: Never Smoker   . Smokeless tobacco: Never Used  . Alcohol Use: 1.0 oz/week    2 drink(s) per week     Comment: occassionally, once or twice per week   family history includes Clotting disorder in her father; Colon cancer in her mother; Diabetes in her mother; Heart disease in her father and mother; and Hypertension in her mother.  Objective:   Physical Exam  well-developed white female in no acute distress, pleasant blood pressure 98/60 pulse 68 height 5 foot 5 weight 149. HEENT; nontraumatic normocephalic EOMI PERRLA sclera anicteric,Neck; Supple no JVD, Cardiovascular ;regular rate and rhythm with S1-S2 no murmur or gallop, Pulmonary; clear bilaterally, Abdomen; soft nontender nondistended bowel sounds are active there is no palpable mass or hepatosplenomegaly, Rectal; exam not done, Extremities; no clubbing cyanosis or edema skin warm and dry, Psych; mood and affect normal and appropriate        Assessment & Plan:  #88 47 year old white female with family history of colon cancer in her mother diagnosed at age 33 who presents for screening colonoscopy. #2 chronic anti-coagulation with Xarelto #3 factor V lieden mutation #4 history of recurrent DVTs-last episode August 2013  Plan; patient is scheduled for colonoscopy with Dr. Alona Simon was discussed in detail with the patient and she is agreeable to proceed. We will obtain consent from  Dr. Myna Simon  for her to stop Xarelto 24 hours prior to the procedure. She is advised that she may take a regular strength aspirin on the days she is off Xarelto.  Addendum: Reviewed and agree with management. Will leave the determination of need for Lovenox bridge to Dr. Myna Simon, the patient's hematologist. Nancy Fiedler, MD

## 2013-05-11 ENCOUNTER — Telehealth: Payer: Self-pay | Admitting: *Deleted

## 2013-05-11 NOTE — Telephone Encounter (Signed)
I called the patient to advise her Nancy Peak PA responded to [place] regarding the Xarelto

## 2013-05-11 NOTE — Telephone Encounter (Signed)
Called pt to advise per Nancy Peak PA , she can stop the Xarelto 24 hours prior to the procedure.  She said she discussed her colonoscopy with Nancy Simon, our Insurance specialis.,The patient said it will be too expense the way we are coding it and having it in our office. ( She meant our [facility] in our building)   She checked with Nancy Simon and they said it would be coded another way and be $81.00. I suggested she call Nancy Simon again and ask if there would be a difference having it at [facility].   I advised her she will need to cancel within 48 to 72 hours ahead of time or she will be billed a fee if she doesn't cancel in time.

## 2013-06-01 ENCOUNTER — Encounter: Payer: BC Managed Care – PPO | Admitting: Internal Medicine

## 2013-09-19 ENCOUNTER — Emergency Department (HOSPITAL_COMMUNITY): Payer: BC Managed Care – PPO

## 2013-09-19 ENCOUNTER — Emergency Department (HOSPITAL_COMMUNITY)
Admission: EM | Admit: 2013-09-19 | Discharge: 2013-09-19 | Disposition: A | Discharge status: Home / Self Care | Payer: BC Managed Care – PPO | Attending: Emergency Medicine | Admitting: Emergency Medicine

## 2013-09-19 ENCOUNTER — Encounter (HOSPITAL_COMMUNITY): Payer: Self-pay | Admitting: Emergency Medicine

## 2013-09-19 DIAGNOSIS — Z86718 Personal history of other venous thrombosis and embolism: Secondary | ICD-10-CM | POA: Insufficient documentation

## 2013-09-19 DIAGNOSIS — R109 Unspecified abdominal pain: Secondary | ICD-10-CM

## 2013-09-19 DIAGNOSIS — Z7901 Long term (current) use of anticoagulants: Secondary | ICD-10-CM | POA: Insufficient documentation

## 2013-09-19 DIAGNOSIS — Z3202 Encounter for pregnancy test, result negative: Secondary | ICD-10-CM | POA: Insufficient documentation

## 2013-09-19 DIAGNOSIS — Z862 Personal history of diseases of the blood and blood-forming organs and certain disorders involving the immune mechanism: Secondary | ICD-10-CM | POA: Insufficient documentation

## 2013-09-19 DIAGNOSIS — K2970 Gastritis, unspecified, without bleeding: Secondary | ICD-10-CM

## 2013-09-19 DIAGNOSIS — Z79899 Other long term (current) drug therapy: Secondary | ICD-10-CM | POA: Insufficient documentation

## 2013-09-19 DIAGNOSIS — R1011 Right upper quadrant pain: Secondary | ICD-10-CM | POA: Insufficient documentation

## 2013-09-19 LAB — URINALYSIS, ROUTINE W REFLEX MICROSCOPIC
Glucose, UA: NEGATIVE mg/dL
Ketones, ur: NEGATIVE mg/dL
Leukocytes, UA: NEGATIVE
Nitrite: NEGATIVE
Protein, ur: NEGATIVE mg/dL

## 2013-09-19 LAB — COMPREHENSIVE METABOLIC PANEL
Albumin: 4.2 g/dL (ref 3.5–5.2)
Alkaline Phosphatase: 32 U/L — ABNORMAL LOW (ref 39–117)
BUN: 14 mg/dL (ref 6–23)
Calcium: 10.7 mg/dL — ABNORMAL HIGH (ref 8.4–10.5)
Creatinine, Ser: 0.74 mg/dL (ref 0.50–1.10)
Potassium: 3.7 mEq/L (ref 3.5–5.1)
Total Protein: 7.1 g/dL (ref 6.0–8.3)

## 2013-09-19 LAB — CBC WITH DIFFERENTIAL/PLATELET
Basophils Relative: 0 % (ref 0–1)
Eosinophils Absolute: 0.1 10*3/uL (ref 0.0–0.7)
Hemoglobin: 12.2 g/dL (ref 12.0–15.0)
MCH: 28.5 pg (ref 26.0–34.0)
MCHC: 34 g/dL (ref 30.0–36.0)
Monocytes Relative: 9 % (ref 3–12)
Neutrophils Relative %: 46 % (ref 43–77)

## 2013-09-19 LAB — LIPASE, BLOOD: Lipase: 46 U/L (ref 11–59)

## 2013-09-19 LAB — TROPONIN I: Troponin I: <0.3 ng/mL (ref <0.30)

## 2013-09-19 MED ORDER — OMEPRAZOLE 20 MG PO CPDR: 20.0000 mg | DELAYED_RELEASE_CAPSULE | Freq: Every day | ORAL | Status: DC

## 2013-09-19 MED ORDER — GI COCKTAIL ~~LOC~~
30.0000 mL | Freq: Once | ORAL | Status: AC
  Administered 2013-09-19: 30 mL via ORAL
  Filled 2013-09-19: qty 30

## 2013-09-19 MED ORDER — PANTOPRAZOLE SODIUM 40 MG PO TBEC
40.0000 mg | DELAYED_RELEASE_TABLET | Freq: Every day | ORAL | Status: DC
  Filled 2013-09-19: qty 1

## 2013-09-19 MED ORDER — PANTOPRAZOLE SODIUM 40 MG PO TBEC
40.0000 mg | DELAYED_RELEASE_TABLET | Freq: Every day | ORAL | Status: DC
  Administered 2013-09-19: 40 mg via ORAL

## 2013-09-19 NOTE — ED Provider Notes (Signed)
CSN: [id number]     Arrival date & time 09/19/13  0026 History   First MD Initiated Contact with Patient 09/19/13 0211     Chief Complaint  Patient presents with  . epigastric pain    (Consider location/radiation/quality/duration/timing/severity/associated sxs/prior Treatment) HPI History provided by patient. Is on Xarelto for DVT and history of factor V Leiden. She has a h/o epigastric and right upper quadrant pain about 2 years ago, evaluated for gallbladder with ultrasound and nuclear medicine study. She saw a general surgeon at the time and decision was made not to remove her gallbladder.  The last 5 days she has had return of the same symptoms, described as gnawing,  decreased appetite tonight with more severe pain and then when she did eat, seemed to make her worse. Fever chills. No nausea vomiting. She previously took Prilosec and tonight has been taking TUMS. At this time pain is minimal.  No chest pain. No shortness of breath. No back pain. No radiation of pain.  Past Medical History  Diagnosis Date  . DVT (deep vein thrombosis) in pregnancy 1996    right leg  . DVT of leg (deep venous thrombosis) 2009    left leg  . Factor 5 Leiden mutation, heterozygous   . Allergy     SEASONAL  . DVT (deep venous thrombosis) 2013    Left Leg   Past Surgical History  Procedure Laterality Date  . Tonsillectomy and adenoidectomy    . Inner ear surgery      ear drum ruptured   Family History  Problem Relation Age of Onset  . Colon cancer Mother   . Diabetes Mother   . Hypertension Mother   . Heart disease Mother     CHF  . Heart disease Father     afib  . Clotting disorder Father    History  Substance Use Topics  . Smoking status: Never Smoker   . Smokeless tobacco: Never Used  . Alcohol Use: 1.0 oz/week    2 drink(s) per week     Comment: occassionally, once or twice per week   OB History   Grav Para Term Preterm Abortions TAB SAB Ect Mult Living   2 2 2       2       Review of Systems  Constitutional: Negative for fever and chills.  Eyes: Negative for visual disturbance.  Respiratory: Negative for shortness of breath.   Cardiovascular: Negative for chest pain.  Gastrointestinal: Positive for abdominal pain. Negative for vomiting.  Genitourinary: Negative for dysuria.  Musculoskeletal: Negative for back pain, neck pain and neck stiffness.  Skin: Negative for rash.  Neurological: Negative for headaches.  All other systems reviewed and are negative.    Allergies  Flagyl  Home Medications   Current Outpatient Rx  Name  Route  Sig  Dispense  Refill  . busPIRone (BUSPAR) 15 MG tablet   Oral   Take 15 mg by mouth 3 (three) times daily.         . fexofenadine (ALLEGRA) 60 MG tablet   Oral   Take 60 mg by mouth daily.           Marland Kitchen MOVIPREP 100 G SOLR   Oral   Take 1 kit (100 g total) by mouth once. "Pharmacist please use BIN: [id number] GROUP: [id number] ID: [id number] Call [phone] for pharmacy questions "Pt will save $10"   1 kit   0     Dispense as written.   Marland Kitchen  Rivaroxaban (XARELTO PO)   Oral   Take by mouth.          BP 161/86  Pulse 78  Temp(Src) 97.7 F (36.5 C) (Oral)  Resp 18  Wt 153 lb 5 oz (69.542 kg)  SpO2 98%  LMP 09/19/2013 Physical Exam  Constitutional: She is oriented to person, place, and time. She appears well-developed and well-nourished.  HENT:  Head: Normocephalic and atraumatic.  Eyes: EOM are normal. Pupils are equal, round, and reactive to light.  Neck: Neck supple.  Cardiovascular: Regular rhythm and intact distal pulses.   Pulmonary/Chest: Effort normal. No respiratory distress.  Abdominal: Soft. Bowel sounds are normal. She exhibits no distension. There is no tenderness. There is no rebound and no guarding.  Mild right upper quadrant tenderness and negative Murphy's sign  Musculoskeletal: Normal range of motion. She exhibits no edema.  Neurological: She is alert and oriented to person,  place, and time.  Skin: Skin is warm and dry.    ED Course  Procedures (including critical care time) Labs Review Labs Reviewed  URINALYSIS, ROUTINE W REFLEX MICROSCOPIC - Abnormal; Notable for the following:    Color, Urine STRAW (*)    Specific Gravity, Urine 1.004 (*)    All other components within normal limits  CBC WITH DIFFERENTIAL - Abnormal; Notable for the following:    HCT 35.9 (*)    All other components within normal limits  COMPREHENSIVE METABOLIC PANEL - Abnormal; Notable for the following:    Calcium 10.7 (*)    Alkaline Phosphatase 32 (*)    All other components within normal limits  PREGNANCY, URINE  LIPASE, BLOOD  TROPONIN I   Imaging Review No results found.  EKG Interpretation     Ventricular Rate:  64 PR Interval:  168 QRS Duration: 90 QT Interval:  408 QTC Calculation: 420 R Axis:   72 Text Interpretation:  Normal sinus rhythm Nonspecific ST abnormality No old tracing to compare           Prilosec and GI cocktail provided  5:58 AM on recheck remains pain-free in the emergency department. Abdominal exam benign. Ultrasound reviewed as above. Plan followup with GI, restart Prilosec with prescription provided. Dietary precautions provided. Reliable historian and agrees to strict return precautions MDM  Diagnosis: Abdominal pain - with negative ultrasound and labs as above presentation suggests gastritis  EKG, labs, ultrasound Medications provided Vital signs and nurses notes reviewed and considered   Sunnie Nielsen, MD 09/19/13 [phone]

## 2013-09-19 NOTE — ED Notes (Signed)
Pt currently in ultrasound.  Family updated

## 2013-09-19 NOTE — ED Notes (Signed)
Epigastric pain since Tuesday  No nv.  Hx of gallstones.lmpact 28th

## 2013-09-19 NOTE — ED Notes (Signed)
She has taken tums and the pain is better

## 2013-09-19 NOTE — ED Notes (Signed)
Pt given some sprite.  No change with Maalox

## 2013-09-19 NOTE — ED Notes (Signed)
Pt is back from [place] and has ambulated to bathroom.  No complaints

## 2013-12-22 ENCOUNTER — Telehealth: Payer: Self-pay | Admitting: Physician Assistant

## 2013-12-22 ENCOUNTER — Other Ambulatory Visit: Payer: Self-pay | Admitting: *Deleted

## 2013-12-22 MED ORDER — MOVIPREP 100 G PO SOLR: 1.0000 | ORAL | Status: DC

## 2013-12-22 NOTE — Telephone Encounter (Signed)
I spoke to the patient and went over her procedure instructions for 01-31-3014.  She was originally scheduled for 06-01-2013.  We already got a response from Dr. Cyndi Bender  Regarding the patient's Xarelto back for the 05-23-2013 procedure. She was to hold the Xarelto for 24 hours prior to the procedure.  She knows to hold the Xarelto on 01-30-2014 and resume it on 02-01-2014.   I went over the entire Moviprep instructions with her with the dates and times.  She thanked me for her help.

## 2014-01-27 ENCOUNTER — Telehealth: Payer: Self-pay | Admitting: Internal Medicine

## 2014-01-27 NOTE — Telephone Encounter (Signed)
Went over the prep instructions with Nancy Simon. She changed her colonoscopy from July 2014 to March 30.  She is also aware to stop her Xarelto  The night before and resume it the following night unless Dr. Hilarie Fredrickson tells her different incase he has to take any polyps off.

## 2014-01-31 ENCOUNTER — Encounter: Payer: Self-pay | Admitting: Internal Medicine

## 2014-01-31 ENCOUNTER — Ambulatory Visit (AMBULATORY_SURGERY_CENTER): Payer: BC Managed Care – PPO | Admitting: Internal Medicine

## 2014-01-31 VITALS — BP 113/71 | HR 59 | Temp 97.9°F | Resp 11 | Ht 65.0 in | Wt 153.0 lb

## 2014-01-31 DIAGNOSIS — Z1211 Encounter for screening for malignant neoplasm of colon: Secondary | ICD-10-CM

## 2014-01-31 DIAGNOSIS — Z800 Family history of malignant neoplasm of digestive organs: Secondary | ICD-10-CM

## 2014-01-31 MED ORDER — SODIUM CHLORIDE 0.9 % IV SOLN: 500.0000 mL | INTRAVENOUS | Status: DC

## 2014-01-31 NOTE — Patient Instructions (Signed)
YOU HAD AN ENDOSCOPIC PROCEDURE TODAY AT THE [facility]: Refer to the procedure report that was given to you for any specific questions about what was found during the examination.  If the procedure report does not answer your questions, please call your gastroenterologist to clarify.  If you requested that your care partner not be given the details of your procedure findings, then the procedure report has been included in a sealed envelope for you to review at your convenience later.  YOU SHOULD EXPECT: Some feelings of bloating in the abdomen. Passage of more gas than usual.  Walking can help get rid of the air that was put into your GI tract during the procedure and reduce the bloating. If you had a lower endoscopy (such as a colonoscopy or flexible sigmoidoscopy) you may notice spotting of blood in your stool or on the toilet paper. If you underwent a bowel prep for your procedure, then you may not have a normal bowel movement for a few days.  DIET: Your first meal following the procedure should be a light meal and then it is ok to progress to your normal diet.  A half-sandwich or bowl of soup is an example of a good first meal.  Heavy or fried foods are harder to digest and may make you feel nauseous or bloated.  Likewise meals heavy in dairy and vegetables can cause extra gas to form and this can also increase the bloating.  Drink plenty of fluids but you should avoid alcoholic beverages for 24 hours.  ACTIVITY: Your care partner should take you home directly after the procedure.  You should plan to take it easy, moving slowly for the rest of the day.  You can resume normal activity the day after the procedure however you should NOT DRIVE or use heavy machinery for 24 hours (because of the sedation medicines used during the test).    SYMPTOMS TO REPORT IMMEDIATELY: A gastroenterologist can be reached at any hour.  During normal business hours, 8:30 AM to 5:00 PM Monday through Friday,  call [phone].  After hours and on weekends, please call the GI answering service at [phone] who will take a message and have the physician on call contact you.   Following lower endoscopy (colonoscopy or flexible sigmoidoscopy):  Excessive amounts of blood in the stool  Significant tenderness or worsening of abdominal pains  Swelling of the abdomen that is new, acute  Fever of 100F or higher  FOLLOW UP: If any biopsies were taken you will be contacted by phone or by letter within the next 1-3 weeks.  Call your gastroenterologist if you have not heard about the biopsies in 3 weeks.  Our staff will call the home number listed on your records the next business day following your procedure to check on you and address any questions or concerns that you may have at that time regarding the information given to you following your procedure. This is a courtesy call and so if there is no answer at the home number and we have not heard from you through the emergency physician on call, we will assume that you have returned to your regular daily activities without incident.  SIGNATURES/CONFIDENTIALITY: You and/or your care partner have signed paperwork which will be entered into your electronic medical record.  These signatures attest to the fact that that the information above on your After Visit Summary has been reviewed and is understood.  Full responsibility of the confidentiality of this   discharge information lies with you and/or your care-partner.  Start your Xarelto back tonight per Dr. Hilarie Fredrickson.

## 2014-01-31 NOTE — Op Note (Signed)
[facility]  [address] [place], [address]   COLONOSCOPY PROCEDURE REPORT  PATIENT: Nancy Simon, Nancy Simon  MR#: [id number] BIRTHDATE: 08/11/1966 , 47  yrs. old GENDER: Female ENDOSCOPIST: Jerene Bears, MD PROCEDURE DATE:  01/31/2014 PROCEDURE:   Colonoscopy, screening First Screening Colonoscopy - Avg.  risk and is 50 yrs.  old or older Yes.  Prior Negative Screening - Now for repeat screening. N/A  History of Adenoma - Now for follow-up colonoscopy & has been > or = to 3 yrs.  N/A  Polyps Removed Today? No.  Recommend repeat exam, <10 yrs? Yes.  High risk (family or personal hx). ASA CLASS:   Class III INDICATIONS:elevated risk screening, Patient's immediate family history of colon cancer, and first colonoscopy. MEDICATIONS: MAC sedation, administered by CRNA and propofol (Diprivan) 200mg  IV  DESCRIPTION OF PROCEDURE:   After the risks benefits and alternatives of the procedure were thoroughly explained, informed consent was obtained.  A digital rectal exam revealed no rectal mass.   The LB [id number]  endoscope was introduced through the anus and advanced to the cecum, which was identified by both the appendix and ileocecal valve. No adverse events experienced. The quality of the prep was excellent, using MoviPrep  The instrument was then slowly withdrawn as the colon was fully examined.   COLON FINDINGS: A normal appearing cecum, ileocecal valve, and appendiceal orifice were identified.  The ascending, hepatic flexure, transverse, splenic flexure, descending, sigmoid colon and rectum appeared unremarkable.  No polyps or cancers were seen. Retroflexed views revealed no abnormalities. The time to cecum=3 minutes 31 seconds.  Withdrawal time=9 minutes 29 seconds.  The scope was withdrawn and the procedure completed.  COMPLICATIONS: There were no complications.  ENDOSCOPIC IMPRESSION: Normal colon  RECOMMENDATIONS: 1.  Given your significant family  history of colon cancer, you should have a repeat colonoscopy in 5 years 2.  Okay to resume anticoagulation now   eSigned:  Jerene Bears, MD 01/31/2014 2:00 PM   cc: The Patient, Daiva Eves, MD, and Burney Gauze, MD

## 2014-01-31 NOTE — Progress Notes (Signed)
Procedure ends, to recovery, report given and VSS. 

## 2014-02-01 ENCOUNTER — Telehealth: Payer: Self-pay | Admitting: *Deleted

## 2014-02-01 NOTE — Telephone Encounter (Signed)
  Follow up Call-  Call back number 01/31/2014 08/02/2011  Post procedure Call Back phone  # [phone]  Permission to leave phone message Yes -     Patient questions:  Do you have a fever, pain , or abdominal swelling? no Pain Score  0 *  Have you tolerated food without any problems? yes  Have you been able to return to your normal activities? yes  Do you have any questions about your discharge instructions: Diet   no Medications  no Follow up visit  no  Do you have questions or concerns about your Care? no  Actions: * If pain score is 4 or above: No action needed, pain <4.

## 2014-02-03 ENCOUNTER — Other Ambulatory Visit: Payer: Self-pay

## 2014-02-03 DIAGNOSIS — Z1231 Encounter for screening mammogram for malignant neoplasm of breast: Secondary | ICD-10-CM

## 2014-03-31 ENCOUNTER — Ambulatory Visit
Admission: RE | Admit: 2014-03-31 | Discharge: 2014-03-31 | Disposition: A | Discharge status: Home / Self Care | Payer: BC Managed Care – PPO | Source: Ambulatory Visit

## 2014-03-31 ENCOUNTER — Encounter (INDEPENDENT_AMBULATORY_CARE_PROVIDER_SITE_OTHER): Payer: Self-pay

## 2014-03-31 DIAGNOSIS — Z1231 Encounter for screening mammogram for malignant neoplasm of breast: Secondary | ICD-10-CM

## 2014-06-07 ENCOUNTER — Encounter: Payer: BC Managed Care – PPO | Admitting: Gynecology

## 2014-07-13 ENCOUNTER — Encounter: Payer: BC Managed Care – PPO | Admitting: Gynecology

## 2014-08-30 ENCOUNTER — Other Ambulatory Visit (HOSPITAL_COMMUNITY)
Admission: RE | Admit: 2014-08-30 | Discharge: 2014-08-30 | Disposition: A | Discharge status: Home / Self Care | Payer: BC Managed Care – PPO | Source: Ambulatory Visit | Attending: Gynecology | Admitting: Gynecology

## 2014-08-30 ENCOUNTER — Encounter: Payer: Self-pay | Admitting: Gynecology

## 2014-08-30 ENCOUNTER — Ambulatory Visit (INDEPENDENT_AMBULATORY_CARE_PROVIDER_SITE_OTHER): Payer: BC Managed Care – PPO | Admitting: Gynecology

## 2014-08-30 VITALS — BP 122/74 | Ht 64.0 in | Wt 155.0 lb

## 2014-08-30 DIAGNOSIS — Z1151 Encounter for screening for human papillomavirus (HPV): Secondary | ICD-10-CM | POA: Insufficient documentation

## 2014-08-30 DIAGNOSIS — Z01419 Encounter for gynecological examination (general) (routine) without abnormal findings: Secondary | ICD-10-CM

## 2014-08-30 NOTE — Patient Instructions (Signed)
You may obtain a copy of any labs that were done today by logging onto MyChart as outlined in the instructions provided with your AVS (after visit summary). The office will not call with normal lab results but certainly if there are any significant abnormalities then we will contact you.   Health Maintenance, Female A healthy lifestyle and preventative care can promote health and wellness.  Maintain regular health, dental, and eye exams.  Eat a healthy diet. Foods like vegetables, fruits, whole grains, low-fat dairy products, and lean protein foods contain the nutrients you need without too many calories. Decrease your intake of foods high in solid fats, added sugars, and salt. Get information about a proper diet from your caregiver, if necessary.  Regular physical exercise is one of the most important things you can do for your health. Most adults should get at least 150 minutes of moderate-intensity exercise (any activity that increases your heart rate and causes you to sweat) each week. In addition, most adults need muscle-strengthening exercises on 2 or more days a week.   Maintain a healthy weight. The body mass index (BMI) is a screening tool to identify possible weight problems. It provides an estimate of body fat based on height and weight. Your caregiver can help determine your BMI, and can help you achieve or maintain a healthy weight. For adults 20 years and older:  A BMI below 18.5 is considered underweight.  A BMI of 18.5 to 24.9 is normal.  A BMI of 25 to 29.9 is considered overweight.  A BMI of 30 and above is considered obese.  Maintain normal blood lipids and cholesterol by exercising and minimizing your intake of saturated fat. Eat a balanced diet with plenty of fruits and vegetables. Blood tests for lipids and cholesterol should begin at age 61 and be repeated every 5 years. If your lipid or cholesterol levels are high, you are over 50, or you are a high risk for heart  disease, you may need your cholesterol levels checked more frequently.Ongoing high lipid and cholesterol levels should be treated with medicines if diet and exercise are not effective.  If you smoke, find out from your caregiver how to quit. If you do not use tobacco, do not start.  Lung cancer screening is recommended for adults aged 33 80 years who are at high risk for developing lung cancer because of a history of smoking. Yearly low-dose computed tomography (CT) is recommended for people who have at least a 30-pack-year history of smoking and are a current smoker or have quit within the past 15 years. A pack year of smoking is smoking an average of 1 pack of cigarettes a day for 1 year (for example: 1 pack a day for 30 years or 2 packs a day for 15 years). Yearly screening should continue until the smoker has stopped smoking for at least 15 years. Yearly screening should also be stopped for people who develop a health problem that would prevent them from having lung cancer treatment.  If you are pregnant, do not drink alcohol. If you are breastfeeding, be very cautious about drinking alcohol. If you are not pregnant and choose to drink alcohol, do not exceed 1 drink per day. One drink is considered to be 12 ounces (355 mL) of beer, 5 ounces (148 mL) of wine, or 1.5 ounces (44 mL) of liquor.  Avoid use of street drugs. Do not share needles with anyone. Ask for help if you need support or instructions about stopping  the use of drugs.  High blood pressure causes heart disease and increases the risk of stroke. Blood pressure should be checked at least every 1 to 2 years. Ongoing high blood pressure should be treated with medicines, if weight loss and exercise are not effective.  If you are 59 to 46 years old, ask your caregiver if you should take aspirin to prevent strokes.  Diabetes screening involves taking a blood sample to check your fasting blood sugar level. This should be done once every 3  years, after age 91, if you are within normal weight and without risk factors for diabetes. Testing should be considered at a younger age or be carried out more frequently if you are overweight and have at least 1 risk factor for diabetes.  Breast cancer screening is essential preventative care for women. You should practice "breast self-awareness." This means understanding the normal appearance and feel of your breasts and may include breast self-examination. Any changes detected, no matter how small, should be reported to a caregiver. Women in their 66s and 30s should have a clinical breast exam (CBE) by a caregiver as part of a regular health exam every 1 to 3 years. After age 101, women should have a CBE every year. Starting at age 100, women should consider having a mammogram (breast X-ray) every year. Women who have a family history of breast cancer should talk to their caregiver about genetic screening. Women at a high risk of breast cancer should talk to their caregiver about having an MRI and a mammogram every year.  Breast cancer gene (BRCA)-related cancer risk assessment is recommended for women who have family members with BRCA-related cancers. BRCA-related cancers include breast, ovarian, tubal, and peritoneal cancers. Having family members with these cancers may be associated with an increased risk for harmful changes (mutations) in the breast cancer genes BRCA1 and BRCA2. Results of the assessment will determine the need for genetic counseling and BRCA1 and BRCA2 testing.  The Pap test is a screening test for cervical cancer. Women should have a Pap test starting at age 57. Between ages 25 and 35, Pap tests should be repeated every 2 years. Beginning at age 37, you should have a Pap test every 3 years as long as the past 3 Pap tests have been normal. If you had a hysterectomy for a problem that was not cancer or a condition that could lead to cancer, then you no longer need Pap tests. If you are  between ages 50 and 76, and you have had normal Pap tests going back 10 years, you no longer need Pap tests. If you have had past treatment for cervical cancer or a condition that could lead to cancer, you need Pap tests and screening for cancer for at least 20 years after your treatment. If Pap tests have been discontinued, risk factors (such as a new sexual partner) need to be reassessed to determine if screening should be resumed. Some women have medical problems that increase the chance of getting cervical cancer. In these cases, your caregiver may recommend more frequent screening and Pap tests.  The human papillomavirus (HPV) test is an additional test that may be used for cervical cancer screening. The HPV test looks for the virus that can cause the cell changes on the cervix. The cells collected during the Pap test can be tested for HPV. The HPV test could be used to screen women aged 44 years and older, and should be used in women of any age  who have unclear Pap test results. After the age of 55, women should have HPV testing at the same frequency as a Pap test.  Colorectal cancer can be detected and often prevented. Most routine colorectal cancer screening begins at the age of 44 and continues through age 20. However, your caregiver may recommend screening at an earlier age if you have risk factors for colon cancer. On a yearly basis, your caregiver may provide home test kits to check for hidden blood in the stool. Use of a small camera at the end of a tube, to directly examine the colon (sigmoidoscopy or colonoscopy), can detect the earliest forms of colorectal cancer. Talk to your caregiver about this at age 86, when routine screening begins. Direct examination of the colon should be repeated every 5 to 10 years through age 13, unless early forms of pre-cancerous polyps or small growths are found.  Hepatitis C blood testing is recommended for all people born from [birth year] through 1963 and any  individual with known risks for hepatitis C.  Practice safe sex. Use condoms and avoid high-risk sexual practices to reduce the spread of sexually transmitted infections (STIs). Sexually active women aged 36 and younger should be checked for Chlamydia, which is a common sexually transmitted infection. Older women with new or multiple partners should also be tested for Chlamydia. Testing for other STIs is recommended if you are sexually active and at increased risk.  Osteoporosis is a disease in which the bones lose minerals and strength with aging. This can result in serious bone fractures. The risk of osteoporosis can be identified using a bone density scan. Women ages 20 and over and women at risk for fractures or osteoporosis should discuss screening with their caregivers. Ask your caregiver whether you should be taking a calcium supplement or vitamin D to reduce the rate of osteoporosis.  Menopause can be associated with physical symptoms and risks. Hormone replacement therapy is available to decrease symptoms and risks. You should talk to your caregiver about whether hormone replacement therapy is right for you.  Use sunscreen. Apply sunscreen liberally and repeatedly throughout the day. You should seek shade when your shadow is shorter than you. Protect yourself by wearing long sleeves, pants, a wide-brimmed hat, and sunglasses year round, whenever you are outdoors.  Notify your caregiver of new moles or changes in moles, especially if there is a change in shape or color. Also notify your caregiver if a mole is larger than the size of a pencil eraser.  Stay current with your immunizations. Document Released: 05/06/2011 Document Revised: 02/15/2013 Document Reviewed: 05/06/2011 [facility] Patient Information 2014 [employer].

## 2014-08-30 NOTE — Progress Notes (Signed)
KYERRA VARGO 1966-09-01 [id number]        48 y.o.  G2P2002 for annual exam.  Doing well from a gynecologic standpoint.  Past medical history,surgical history, problem list, medications, allergies, family history and social history were all reviewed and documented as reviewed in the EPIC chart.  ROS:  12 system ROS performed with pertinent positives and negatives included in the history, assessment and plan.   Additional significant findings :  none   Exam: Kim Counsellor Vitals:   08/30/14 1540  BP: 122/74  Height: 5\' 4"  (1.626 m)  Weight: 155 lb (70.308 kg)   General appearance:  Normal affect, orientation and appearance. Skin: Grossly normal HEENT: Without gross lesions.  No cervical or supraclavicular adenopathy. Thyroid normal.  Lungs:  Clear without wheezing, rales or rhonchi Cardiac: RR, without RMG Abdominal:  Soft, nontender, without masses, guarding, rebound, organomegaly or hernia Breasts:  Examined lying and sitting without masses, retractions, discharge or axillary adenopathy. Pelvic:  Ext/BUS/vagina normal  Cervix normal. Pap/HPV  Uterus anteverted, normal size, shape and contour, midline and mobile nontender   Adnexa  Without masses or tenderness    Anus and perineum  Normal   Rectovaginal  Normal sphincter tone without palpated masses or tenderness.    Assessment/Plan:  48 y.o. G23P2002 female for annual exam with regular menses, vasectomy birth control.   1. History of heavier menses before. Off of traditional anticoagulation and on Xarelto doing better with slight her menses. 2. Pap smear 2012.  Pap/HPV today. No history of significant abnormal Pap smears previously. Plan repeat Pap smear at 3-5 year interval assuming this Pap smear is normal per current screening guidelines. 3. Mammography 03/2014. Continue with annual mammography. SBE monthly reviewed. 4. Health maintenance. Being worked up for right upper abdominal pain. Felt to be possibly gallbladder  but no overt stones. Has seen a general surgeon who offered cholecystectomy. She'll continue to follow up with them in reference to her pain. Her exam today is normal. No routine blood work done and she reports this done at her primary physician's office. Follow up in one year, sooner as needed.     Anastasio Auerbach MD, 4:00 PM 08/30/2014

## 2014-08-30 NOTE — Addendum Note (Signed)
Addended by: Nelva Nay on: 08/30/2014 04:28 PM   Modules accepted: Orders

## 2014-08-31 LAB — URINALYSIS W MICROSCOPIC + REFLEX CULTURE
BILIRUBIN URINE: NEGATIVE
Bacteria, UA: NONE SEEN
Casts: NONE SEEN
Crystals: NONE SEEN
GLUCOSE, UA: NEGATIVE mg/dL
HGB URINE DIPSTICK: NEGATIVE
Ketones, ur: NEGATIVE mg/dL
Leukocytes, UA: NEGATIVE
Nitrite: NEGATIVE
PH: 7 (ref 5.0–8.0)
PROTEIN: NEGATIVE mg/dL
Specific Gravity, Urine: <1.005 — ABNORMAL LOW (ref 1.005–1.030)
Squamous Epithelial / LPF: NONE SEEN
Urobilinogen, UA: 0.2 mg/dL (ref 0.0–1.0)

## 2014-09-01 LAB — CYTOLOGY - PAP

## 2014-09-05 ENCOUNTER — Encounter: Payer: Self-pay | Admitting: Gynecology

## 2015-05-01 ENCOUNTER — Other Ambulatory Visit: Payer: Self-pay

## 2015-05-01 DIAGNOSIS — Z1231 Encounter for screening mammogram for malignant neoplasm of breast: Secondary | ICD-10-CM

## 2015-05-04 ENCOUNTER — Ambulatory Visit: Payer: BC Managed Care – PPO

## 2015-05-12 ENCOUNTER — Ambulatory Visit
Admission: RE | Admit: 2015-05-12 | Discharge: 2015-05-12 | Disposition: A | Discharge status: Home / Self Care | Payer: BC Managed Care – PPO | Source: Ambulatory Visit

## 2015-05-12 DIAGNOSIS — Z1231 Encounter for screening mammogram for malignant neoplasm of breast: Secondary | ICD-10-CM

## 2016-01-23 ENCOUNTER — Ambulatory Visit (INDEPENDENT_AMBULATORY_CARE_PROVIDER_SITE_OTHER): Payer: BC Managed Care – PPO | Admitting: Gynecology

## 2016-01-23 ENCOUNTER — Encounter: Payer: Self-pay | Admitting: Gynecology

## 2016-01-23 VITALS — BP 116/74 | Ht 65.0 in | Wt 153.0 lb

## 2016-01-23 DIAGNOSIS — N926 Irregular menstruation, unspecified: Secondary | ICD-10-CM

## 2016-01-23 DIAGNOSIS — Z01419 Encounter for gynecological examination (general) (routine) without abnormal findings: Secondary | ICD-10-CM

## 2016-01-23 DIAGNOSIS — N951 Menopausal and female climacteric states: Secondary | ICD-10-CM

## 2016-01-23 LAB — TSH: TSH: 1.7 m[IU]/L

## 2016-01-23 LAB — FOLLICLE STIMULATING HORMONE: FSH: 59.1 m[IU]/mL

## 2016-01-23 NOTE — Patient Instructions (Signed)
Office will call you with the blood test results.  You may obtain a copy of any labs that were done today by logging onto MyChart as outlined in the instructions provided with your AVS (after visit summary). The office will not call with normal lab results but certainly if there are any significant abnormalities then we will contact you.   Health Maintenance Adopting a healthy lifestyle and getting preventive care can go a long way to promote health and wellness. Talk with your health care provider about what schedule of regular examinations is right for you. This is a good chance for you to check in with your provider about disease prevention and staying healthy. In between checkups, there are plenty of things you can do on your own. Experts have done a lot of research about which lifestyle changes and preventive measures are most likely to keep you healthy. Ask your health care provider for more information. WEIGHT AND DIET  Eat a healthy diet  Be sure to include plenty of vegetables, fruits, low-fat dairy products, and lean protein.  Do not eat a lot of foods high in solid fats, added sugars, or salt.  Get regular exercise. This is one of the most important things you can do for your health.  Most adults should exercise for at least 150 minutes each week. The exercise should increase your heart rate and make you sweat (moderate-intensity exercise).  Most adults should also do strengthening exercises at least twice a week. This is in addition to the moderate-intensity exercise.  Maintain a healthy weight  Body mass index (BMI) is a measurement that can be used to identify possible weight problems. It estimates body fat based on height and weight. Your health care provider can help determine your BMI and help you achieve or maintain a healthy weight.  For females 81 years of age and older:   A BMI below 18.5 is considered underweight.  A BMI of 18.5 to 24.9 is normal.  A BMI of 25 to  29.9 is considered overweight.  A BMI of 30 and above is considered obese.  Watch levels of cholesterol and blood lipids  You should start having your blood tested for lipids and cholesterol at 48 years of age, then have this test every 5 years.  You may need to have your cholesterol levels checked more often if:  Your lipid or cholesterol levels are high.  You are older than 48 years of age.  You are at high risk for heart disease.  CANCER SCREENING   Lung Cancer  Lung cancer screening is recommended for adults 19-62 years old who are at high risk for lung cancer because of a history of smoking.  A yearly low-dose CT scan of the lungs is recommended for people who:  Currently smoke.  Have quit within the past 15 years.  Have at least a 30-pack-year history of smoking. A pack year is smoking an average of one pack of cigarettes a day for 1 year.  Yearly screening should continue until it has been 15 years since you quit.  Yearly screening should stop if you develop a health problem that would prevent you from having lung cancer treatment.  Breast Cancer  Practice breast self-awareness. This means understanding how your breasts normally appear and feel.  It also means doing regular breast self-exams. Let your health care provider know about any changes, no matter how small.  If you are in your 20s or 30s, you should have a clinical  breast exam (CBE) by a health care provider every 1-3 years as part of a regular health exam.  If you are 40 or older, have a CBE every year. Also consider having a breast X-ray (mammogram) every year.  If you have a family history of breast cancer, talk to your health care provider about genetic screening.  If you are at high risk for breast cancer, talk to your health care provider about having an MRI and a mammogram every year.  Breast cancer gene (BRCA) assessment is recommended for women who have family members with BRCA-related  cancers. BRCA-related cancers include:  Breast.  Ovarian.  Tubal.  Peritoneal cancers.  Results of the assessment will determine the need for genetic counseling and BRCA1 and BRCA2 testing. Cervical Cancer Routine pelvic examinations to screen for cervical cancer are no longer recommended for nonpregnant women who are considered low risk for cancer of the pelvic organs (ovaries, uterus, and vagina) and who do not have symptoms. A pelvic examination may be necessary if you have symptoms including those associated with pelvic infections. Ask your health care provider if a screening pelvic exam is right for you.   The Pap test is the screening test for cervical cancer for women who are considered at risk.  If you had a hysterectomy for a problem that was not cancer or a condition that could lead to cancer, then you no longer need Pap tests.  If you are older than 65 years, and you have had normal Pap tests for the past 10 years, you no longer need to have Pap tests.  If you have had past treatment for cervical cancer or a condition that could lead to cancer, you need Pap tests and screening for cancer for at least 20 years after your treatment.  If you no longer get a Pap test, assess your risk factors if they change (such as having a new sexual partner). This can affect whether you should start being screened again.  Some women have medical problems that increase their chance of getting cervical cancer. If this is the case for you, your health care provider may recommend more frequent screening and Pap tests.  The human papillomavirus (HPV) test is another test that may be used for cervical cancer screening. The HPV test looks for the virus that can cause cell changes in the cervix. The cells collected during the Pap test can be tested for HPV.  The HPV test can be used to screen women 30 years of age and older. Getting tested for HPV can extend the interval between normal Pap tests from  three to five years.  An HPV test also should be used to screen women of any age who have unclear Pap test results.  After 48 years of age, women should have HPV testing as often as Pap tests.  Colorectal Cancer  This type of cancer can be detected and often prevented.  Routine colorectal cancer screening usually begins at 48 years of age and continues through 48 years of age.  Your health care provider may recommend screening at an earlier age if you have risk factors for colon cancer.  Your health care provider may also recommend using home test kits to check for hidden blood in the stool.  A small camera at the end of a tube can be used to examine your colon directly (sigmoidoscopy or colonoscopy). This is done to check for the earliest forms of colorectal cancer.  Routine screening usually begins at age   50.  Direct examination of the colon should be repeated every 5-10 years through 48 years of age. However, you may need to be screened more often if early forms of precancerous polyps or small growths are found. Skin Cancer  Check your skin from head to toe regularly.  Tell your health care provider about any new moles or changes in moles, especially if there is a change in a mole's shape or color.  Also tell your health care provider if you have a mole that is larger than the size of a pencil eraser.  Always use sunscreen. Apply sunscreen liberally and repeatedly throughout the day.  Protect yourself by wearing long sleeves, pants, a wide-brimmed hat, and sunglasses whenever you are outside. HEART DISEASE, DIABETES, AND HIGH BLOOD PRESSURE   Have your blood pressure checked at least every 1-2 years. High blood pressure causes heart disease and increases the risk of stroke.  If you are between 70 years and 59 years old, ask your health care provider if you should take aspirin to prevent strokes.  Have regular diabetes screenings. This involves taking a blood sample to check  your fasting blood sugar level.  If you are at a normal weight and have a low risk for diabetes, have this test once every three years after 48 years of age.  If you are overweight and have a high risk for diabetes, consider being tested at a younger age or more often. PREVENTING INFECTION  Hepatitis B  If you have a higher risk for hepatitis B, you should be screened for this virus. You are considered at high risk for hepatitis B if:  You were born in a country where hepatitis B is common. Ask your health care provider which countries are considered high risk.  Your parents were born in a high-risk country, and you have not been immunized against hepatitis B (hepatitis B vaccine).  You have HIV or AIDS.  You use needles to inject street drugs.  You live with someone who has hepatitis B.  You have had sex with someone who has hepatitis B.  You get hemodialysis treatment.  You take certain medicines for conditions, including cancer, organ transplantation, and autoimmune conditions. Hepatitis C  Blood testing is recommended for:  Everyone born from [birth year] through 1963.  Anyone with known risk factors for hepatitis C. Sexually transmitted infections (STIs)  You should be screened for sexually transmitted infections (STIs) including gonorrhea and chlamydia if:  You are sexually active and are younger than 48 years of age.  You are older than 48 years of age and your health care provider tells you that you are at risk for this type of infection.  Your sexual activity has changed since you were last screened and you are at an increased risk for chlamydia or gonorrhea. Ask your health care provider if you are at risk.  If you do not have HIV, but are at risk, it may be recommended that you take a prescription medicine daily to prevent HIV infection. This is called pre-exposure prophylaxis (PrEP). You are considered at risk if:  You are sexually active and do not regularly use  condoms or know the HIV status of your partner(s).  You take drugs by injection.  You are sexually active with a partner who has HIV. Talk with your health care provider about whether you are at high risk of being infected with HIV. If you choose to begin PrEP, you should first be tested for HIV. You should  then be tested every 3 months for as long as you are taking PrEP.  PREGNANCY   If you are premenopausal and you may become pregnant, ask your health care provider about preconception counseling.  If you may become pregnant, take 400 to 800 micrograms (mcg) of folic acid every day.  If you want to prevent pregnancy, talk to your health care provider about birth control (contraception). OSTEOPOROSIS AND MENOPAUSE   Osteoporosis is a disease in which the bones lose minerals and strength with aging. This can result in serious bone fractures. Your risk for osteoporosis can be identified using a bone density scan.  If you are 53 years of age or older, or if you are at risk for osteoporosis and fractures, ask your health care provider if you should be screened.  Ask your health care provider whether you should take a calcium or vitamin D supplement to lower your risk for osteoporosis.  Menopause may have certain physical symptoms and risks.  Hormone replacement therapy may reduce some of these symptoms and risks. Talk to your health care provider about whether hormone replacement therapy is right for you.  HOME CARE INSTRUCTIONS   Schedule regular health, dental, and eye exams.  Stay current with your immunizations.   Do not use any tobacco products including cigarettes, chewing tobacco, or electronic cigarettes.  If you are pregnant, do not drink alcohol.  If you are breastfeeding, limit how much and how often you drink alcohol.  Limit alcohol intake to no more than 1 drink per day for nonpregnant women. One drink equals 12 ounces of beer, 5 ounces of wine, or 1 ounces of hard  liquor.  Do not use street drugs.  Do not share needles.  Ask your health care provider for help if you need support or information about quitting drugs.  Tell your health care provider if you often feel depressed.  Tell your health care provider if you have ever been abused or do not feel safe at home. Document Released: 05/06/2011 Document Revised: 03/07/2014 Document Reviewed: 09/22/2013 [facility] Patient Information 2015 [place], [place]. This information is not intended to replace advice given to you by your health care provider. Make sure you discuss any questions you have with your health care provider.

## 2016-01-23 NOTE — Progress Notes (Signed)
    Nancy Simon 1966/02/28 [id number]        50 y.o.  G2P2002  for annual exam.  Several issues noted below.  Past medical history,surgical history, problem list, medications, allergies, family history and social history were all reviewed and documented as reviewed in the EPIC chart.  ROS:  Performed with pertinent positives and negatives included in the history, assessment and plan.   Additional significant findings :  none   Exam: Caryn Bee assistant Filed Vitals:   01/23/16 1526  BP: 116/74  Height: 5\' 5"  (1.651 m)  Weight: 153 lb (69.4 kg)   General appearance:  Normal affect, orientation and appearance. Skin: Grossly normal HEENT: Without gross lesions.  No cervical or supraclavicular adenopathy. Thyroid normal.  Lungs:  Clear without wheezing, rales or rhonchi Cardiac: RR, without RMG Abdominal:  Soft, nontender, without masses, guarding, rebound, organomegaly or hernia Breasts:  Examined lying and sitting without masses, retractions, discharge or axillary adenopathy. Pelvic:  Ext/BUS/vagina normal  Cervix normal  Uterus anteverted, normal size, shape and contour, midline and mobile nontender   Adnexa without masses or tenderness    Anus and perineum normal   Rectovaginal normal sphincter tone without palpated masses or tenderness.    Assessment/Plan:  50 y.o. G57P2002 female for annual exam, scant irregular menses, vasectomy birth control.   1. Scant irregular menses/menopausal symptoms. Over the past year patient has had very light one day bleeding every several months. She has developed significant on and off hot flashes and night sweats. No prolonged or heavy bleeding. Will check baseline FSH/TSH. Options for management reviewed. 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 Nonhormonal options to include soy-based as well as Effexor type medications reviewed. At this point the patient does not want any treatment but  prefers to monitor. She'll follow up for her hormone test results.  Will keep menstrual calendar as long as less frequent but scant bleeding them will monitor. If prolonged or atypical bleeding then follow up for further evaluation. 2. Mammography 05/2015. Continue with annual mammography when due. SBE monthly reviewed. 3. Pap smear/HPV 08/2015 normal. No Pap smear done today. No history of significant abnormal Pap smears.  Plan repeat Pap smear at 5 year interval per current screening guidelines. 4. Colonoscopy 2015. Repeat at their recommended interval. 5. Health maintenance. No routine lab work done as patient reports is done elsewhere. Follow up in one year, sooner as needed.   Anastasio Auerbach MD, 3:49 PM 01/23/2016

## 2016-03-07 ENCOUNTER — Encounter: Payer: Self-pay | Admitting: Podiatry

## 2016-03-07 ENCOUNTER — Ambulatory Visit (INDEPENDENT_AMBULATORY_CARE_PROVIDER_SITE_OTHER): Payer: BC Managed Care – PPO | Admitting: Podiatry

## 2016-03-07 ENCOUNTER — Ambulatory Visit (INDEPENDENT_AMBULATORY_CARE_PROVIDER_SITE_OTHER): Payer: BC Managed Care – PPO

## 2016-03-07 VITALS — BP 148/87 | HR 55 | Resp 18

## 2016-03-07 DIAGNOSIS — R52 Pain, unspecified: Secondary | ICD-10-CM

## 2016-03-07 DIAGNOSIS — M79672 Pain in left foot: Secondary | ICD-10-CM | POA: Diagnosis not present

## 2016-03-07 DIAGNOSIS — M722 Plantar fascial fibromatosis: Secondary | ICD-10-CM

## 2016-03-07 NOTE — Patient Instructions (Signed)

## 2016-03-07 NOTE — Progress Notes (Signed)
Subjective:    Patient ID: Nancy Simon, female    DOB: [birth year], 50 y.o.   MRN: [id number]  HPI  50 year old female presents the office they for concerns of pain to the bottom of her left tailors been ongoing for about 1 year. She states that she wants an over-the-counter inserts which helped and she is having no pain over the winter as she wore the inserts in her boots. She states that since the weather has become warmer she has been wearing acute sandals and she cannot wear flat shoes because of the heel pain. She states that she gets pain is at the end of the day after walking. No pain in the morning. She states that she has no pain when she wears a tennis shoe or when she runs. The only time that she gets pain is with flat shoes. She denies any numbness or tingling. She has a chronic swan the left leg due to a history of 2 DVTs. Denies any recent injury or trauma. No other treatment.  Review of Systems  All other systems reviewed and are negative.      Objective:   Physical Exam General: AAO x3, NAD  Dermatological: Skin is warm, dry and supple bilateral. Nails x 10 are well manicured; remaining integument appears unremarkable at this time. There are no open sores, no preulcerative lesions, no rash or signs of infection present.  Vascular: Dorsalis Pedis artery and Posterior Tibial artery pedal pulses are 2/4 bilateral with immedate capillary fill time. Pedal hair growth present. Mild chronic swelling left foot, leg due to history of DVT.  There is no pain with calf compression, swelling, warmth, erythema.   Neruologic: Grossly intact via light touch bilateral. Vibratory intact via tuning fork bilateral. Protective threshold with Semmes Wienstein monofilament intact to all pedal sites bilateral. Patellar and Achilles deep tendon reflexes 2+ bilateral. No Babinski or clonus noted bilateral.   Musculoskeletal: There is mild tenderness to palpation along the plantar medial tubercle of  the calcaneus at the insertion of plantar fascia on the left foot. There is no pain along the course of the plantar fascia within the arch of the foot. Plantar fascia appears to be intact. There is no pain with lateral compression of the calcaneus or pain with vibratory sensation. There is no pain along the course or insertion of the achilles tendon. There does appear to be mild atrophy of the heel fat pad. No other areas of tenderness to bilateral lower extremities.   Gait: Unassisted, Nonantalgic.      Assessment & Plan:  51 year old female left heel pain for the plantar fasciitis likely due to biomechanical changes. -Treatment options discussed including all alternatives, risks, and complications -Etiology of symptoms were discussed -X-rays were obtained and reviewed with the patient. Small heel spur present. No evidence of acute fracture. -I discussed steroid injection to the area however given that she is not having much pain at this time her pain is only with certain shoes at the end of the day after that her pain is more biomechanical in nature. I believe that we need to do better job controlling her foot structure with shoe gear and possible orthotics. I discussed with her sandals with good arch support. She states that she has tried Vionic sandals and she states that he felt great and she was having no heel pain with this. I recommend her continue with the shoe like this. -Dispensed plantar fascial brace in the short-term to wear. -Given history  of DVT with chronic anticoagulation she has been taking oral anti-inflammatories I recommended her not to do this. Prescribed compound cream which is ordered today. -Stretching and icing daily. -Follow-up 4 weeks. If symptoms continue will consider a steroid injection.   Celesta Gentile, DPM

## 2016-03-08 DIAGNOSIS — M79673 Pain in unspecified foot: Secondary | ICD-10-CM | POA: Insufficient documentation

## 2016-03-08 DIAGNOSIS — M722 Plantar fascial fibromatosis: Secondary | ICD-10-CM | POA: Insufficient documentation

## 2016-03-08 MED ORDER — NONFORMULARY OR COMPOUNDED ITEM: Status: DC

## 2016-03-08 NOTE — Addendum Note (Signed)
Addended by: Harriett Sine D on: 03/08/2016 10:18 AM   Modules accepted: Orders

## 2016-04-04 ENCOUNTER — Ambulatory Visit: Payer: BC Managed Care – PPO | Admitting: Podiatry

## 2016-05-06 ENCOUNTER — Other Ambulatory Visit: Payer: Self-pay | Admitting: Gynecology

## 2016-05-06 DIAGNOSIS — Z1231 Encounter for screening mammogram for malignant neoplasm of breast: Secondary | ICD-10-CM

## 2016-05-14 ENCOUNTER — Ambulatory Visit: Payer: BC Managed Care – PPO

## 2016-05-15 ENCOUNTER — Ambulatory Visit
Admission: RE | Admit: 2016-05-15 | Discharge: 2016-05-15 | Disposition: A | Discharge status: Home / Self Care | Payer: BC Managed Care – PPO | Source: Ambulatory Visit | Attending: Gynecology | Admitting: Gynecology

## 2016-05-15 DIAGNOSIS — Z1231 Encounter for screening mammogram for malignant neoplasm of breast: Secondary | ICD-10-CM

## 2016-12-27 ENCOUNTER — Other Ambulatory Visit: Payer: Self-pay

## 2016-12-27 DIAGNOSIS — L97909 Non-pressure chronic ulcer of unspecified part of unspecified lower leg with unspecified severity: Principal | ICD-10-CM

## 2016-12-27 DIAGNOSIS — I83009 Varicose veins of unspecified lower extremity with ulcer of unspecified site: Secondary | ICD-10-CM

## 2017-01-13 ENCOUNTER — Encounter: Payer: Self-pay | Admitting: Vascular Surgery

## 2017-01-24 ENCOUNTER — Encounter: Payer: Self-pay | Admitting: Vascular Surgery

## 2017-01-24 ENCOUNTER — Ambulatory Visit (INDEPENDENT_AMBULATORY_CARE_PROVIDER_SITE_OTHER): Payer: BC Managed Care – PPO | Admitting: Vascular Surgery

## 2017-01-24 ENCOUNTER — Ambulatory Visit (HOSPITAL_COMMUNITY)
Admission: RE | Admit: 2017-01-24 | Discharge: 2017-01-24 | Disposition: A | Discharge status: Home / Self Care | Payer: BC Managed Care – PPO | Source: Ambulatory Visit | Attending: Vascular Surgery | Admitting: Vascular Surgery

## 2017-01-24 VITALS — BP 112/74 | HR 60 | Temp 97.3°F | Resp 16 | Ht 65.0 in | Wt 160.0 lb

## 2017-01-24 DIAGNOSIS — I872 Venous insufficiency (chronic) (peripheral): Secondary | ICD-10-CM | POA: Diagnosis not present

## 2017-01-24 DIAGNOSIS — L97909 Non-pressure chronic ulcer of unspecified part of unspecified lower leg with unspecified severity: Secondary | ICD-10-CM

## 2017-01-24 DIAGNOSIS — L97929 Non-pressure chronic ulcer of unspecified part of left lower leg with unspecified severity: Secondary | ICD-10-CM

## 2017-01-24 DIAGNOSIS — I83009 Varicose veins of unspecified lower extremity with ulcer of unspecified site: Secondary | ICD-10-CM | POA: Insufficient documentation

## 2017-01-24 DIAGNOSIS — I83029 Varicose veins of left lower extremity with ulcer of unspecified site: Secondary | ICD-10-CM

## 2017-01-24 NOTE — Progress Notes (Signed)
Referred by:  Leonides Sake, MD [address] [place], [place] [address]  Reason for referral: left medial ankle wound   History of Present Illness  Nancy Simon is a 51 y.o. (1966/10/09) female who presents with chief complaint: non-healing medial ankle wound.  Patient notes developing a wound on medial ankle in Dec 2017.  She thinks it may have been related to a varicosity and possible injury with a boot.  She think the injury occurred a vein.  She noted drainage some dark gelatinous material from the wound.  She proceed to develop an inflammatory rim around the injury what was very tender to touch.  The wound reported failed to heal from several months until some steroid creme was applied to the wound.  Additionally, the patient notes with extended standing mild swelling with more of a sense of heaviness.  The patient has had known history of repeat DVT x 3, known history of Factor V Leiden, known history of pregnancy, known history of varicose vein, no history of venous stasis ulcers, no history of  Lymphedema and no history of skin changes in lower legs.  There is known family history of venous disorders.  The patient has not used compression stockings in the past.   Past Medical History:  Diagnosis Date  . Allergy    SEASONAL  . DVT (deep vein thrombosis) in pregnancy ([place]) 1996   right leg  . DVT (deep venous thrombosis) ([place]) 2013   Left Leg  . DVT of leg (deep venous thrombosis) ([place]) 2009   left leg  . Factor 5 Leiden mutation, heterozygous [facility])     Past Surgical History:  Procedure Laterality Date  . TONSILLECTOMY AND ADENOIDECTOMY    . TYMPANOPLASTY      Social History   Social History  . Marital status: Married    Spouse name: N/A  . Number of children: 2  . Years of education: N/A   Occupational History  . [employer]   Social History Main Topics  . Smoking status: Never Smoker  . Smokeless tobacco: Never Used  .  Alcohol use 1.2 oz/week    2 Standard drinks or equivalent per week     Comment: occassionally, once or twice per week  . Drug use: No  . Sexual activity: Yes    Partners: Male    Birth control/ protection: Other-see comments     Comment: vasectomy-1st intercourse 51 yo-Fewer than 5 partners   Other Topics Concern  . Not on file   Social History Narrative  . No narrative on file    Family History  Problem Relation Age of Onset  . Colon cancer Mother   . Diabetes Mother   . Hypertension Mother   . Heart disease Mother     CHF  . Heart disease Father     afib  . Clotting disorder Father   . Liver disease Father   . Leukemia Maternal Grandmother     Current Outpatient Prescriptions  Medication Sig Dispense Refill  . fexofenadine (ALLEGRA) 60 MG tablet Take 60 mg by mouth daily as needed for rhinitis.     Marland Kitchen PARoxetine (PAXIL) 10 MG tablet Take 10 mg by mouth daily.    . Rivaroxaban (XARELTO) 15 MG TABS tablet Take 15 mg by mouth at bedtime.     No current facility-administered medications for this visit.     Allergies  Allergen Reactions  . Flagyl [Metronidazole Hcl]  Can't take with coumadin.     REVIEW OF SYSTEMS:   Cardiac:  positive for: no symptoms, negative for: Chest pain or chest pressure, Shortness of breath upon exertion and Shortness of breath when lying flat,   Vascular:  positive for: leg swelling, +DVT,  negative for: Pain in calf, thigh, or hip brought on by ambulation and Pain in feet at night that wakes you up from your sleep   Pulmonary:  positive for: no symptoms,  negative for: Oxygen at home, Productive cough and Wheezing  Neurologic:  positive for: No symptoms, negative for: Sudden weakness in arms or legs, Sudden numbness in arms or legs, Sudden onset of difficulty speaking or slurred speech, Temporary loss of vision in one eye and Problems with dizziness  Gastrointestinal:  positive for: no symptoms, negative for: Blood in stool  and Vomited blood  Genitourinary:  positive for: no symptoms, negative for: Burning when urinating and Blood in urine  Psychiatric:  positive for: no symptoms,  negative for: Major depression  Hematologic:  positive for: no symptoms,  negative for: negative for: Bleeding problems and Problems with blood clotting too easily  Dermatologic:  positive for: no symptoms, negative for: Rashes or ulcers  Constitutional:  positive for: no symptoms, negative for: Fever or chills  Ear/Nose/Throat:  positive for: no symptoms, negative for: Change in hearing, Nose bleeds and Sore throat  Musculoskeletal:  positive for: no symptoms, negative for: Back pain, Joint pain and Muscle pain   Physical Examination  Vitals:   01/24/17 1410  BP: 112/74  Pulse: 60  Resp: 16  Temp: 97.3 F (36.3 C)  TempSrc: Oral  SpO2: 98%  Weight: 160 lb (72.6 kg)  Height: 5\' 5"  (1.651 m)    Body mass index is 26.63 kg/m.  General: Alert, O x 3, WD, NAD  Head: [place]/AT,    Ear/Nose/Throat: Hearing grossly intact, nares without erythema or drainage, oropharynx without Erythema or Exudate, Mallampati score: 3, Dentition intact  Eyes: PERRLA, EOMI,    Neck: Supple, mid-line trachea,    Pulmonary: Sym exp, good B air movt, CTA B  Cardiac: RRR, Nl S1, S2, no Murmurs, No rubs, No S3,S4  Vascular: Vessel Right Left  Radial Palpable Palpable  Brachial Palpable Palpable  Carotid Palpable Palpable  Aorta Not palpable due to pannus N/A  Femoral Palpable Palpable  Popliteal Not palpable Not palpable  PT Palpable Palpable  DP Palpable Palpable   Gastrointestinal: soft, non-distended, non-tender to palpation, No guarding or rebound, no HSM, no masses, no CVAT B, No palpable prominent aortic pulse,    Musculoskeletal: M/S 5/5 throughout  , Extremities without ischemic changes  , Edema present: mild L 1+, Varicosities present: L>>R, No Lipodermatosclerosis present, contracted wound just proximal to  medial malleolus with variegated appearance radiating out from a hemorrhagic central point  Neurologic: Cranial nerves grossly intact , Pain and light touch intact in extremities , Motor exam as listed above  Psychiatric: Judgement intact, Mood & affect appropriate for pt's clinical situation  Dermatologic: See M/S exam for extremity exam, No rashes otherwise noted  Lymph : Palpable lymph nodes: None   Non-Invasive Vascular Imaging  BLE Venous Insufficiency Duplex (Date: 01/24/2017):   RLE:   No DVT and SVT,   + GSV reflux: <4.5 mm  + SSV reflux: 2-5.7 mm  + deep venous reflux: CFV, SFJ  LLE:  no DVT and SVT,   + GSV reflux: 5.3-7.0 mm  no SSV reflux,  + deep venous reflux: CFV,  PV, SFJ   Medical Decision Making  Nancy Simon is a 51 y.o. female who presents with: BLE chronic venous insufficiency (C2), varicose veins with complications, left ankle wound   Unclear to me based on exam and history whether her medial ankle wound is venous related or represents some sort of dermatologic condition.  If her wound resolves with compression, then can forgo further evaluation otherwise a non-healing wound should be evaluated by Dermatology for possible skin biopsy.  Based on the patient's history and examination, I recommend: compressive therapy.  I discussed with the patient the use of her 20-30 mm thigh high compression stockings and need for 3 month trial of such.  The patient will follow up in 3 months with my partners in the [facility] for evaluation for: EVLA L GSV.  Thank you for allowing [place] to participate in this patient's care.   Adele Barthel, MD, FACS [facility] [place] Office: [phone] Pager: [phone]  01/24/2017, 2:51 PM

## 2017-02-06 ENCOUNTER — Encounter: Payer: BC Managed Care – PPO | Admitting: Gynecology

## 2017-02-25 ENCOUNTER — Encounter: Payer: Self-pay | Admitting: Gynecology

## 2017-02-25 ENCOUNTER — Ambulatory Visit (INDEPENDENT_AMBULATORY_CARE_PROVIDER_SITE_OTHER): Payer: BC Managed Care – PPO | Admitting: Gynecology

## 2017-02-25 VITALS — BP 122/76 | Ht 64.5 in | Wt 165.0 lb

## 2017-02-25 DIAGNOSIS — Z01419 Encounter for gynecological examination (general) (routine) without abnormal findings: Secondary | ICD-10-CM

## 2017-02-25 NOTE — Patient Instructions (Signed)

## 2017-02-25 NOTE — Progress Notes (Signed)
    Nancy Simon [birth year] [id number]        51 y.o.  G2P2002 for annual exam.    Past medical history,surgical history, problem list, medications, allergies, family history and social history were all reviewed and documented as reviewed in the EPIC chart.  ROS:  Performed with pertinent positives and negatives included in the history, assessment and plan.   Additional significant findings :  None   Exam: Caryn Bee assistant Vitals:   02/25/17 1525  BP: 122/76  Weight: 165 lb (74.8 kg)  Height: 5' 4.5" (1.638 m)   Body mass index is 27.88 kg/m.  General appearance:  Normal affect, orientation and appearance. Skin: Grossly normal HEENT: Without gross lesions.  No cervical or supraclavicular adenopathy. Thyroid normal.  Lungs:  Clear without wheezing, rales or rhonchi Cardiac: RR, without RMG Abdominal:  Soft, nontender, without masses, guarding, rebound, organomegaly or hernia Breasts:  Examined lying and sitting without masses, retractions, discharge or axillary adenopathy. Pelvic:  Ext, BUS, Vagina: Normal  Cervix: Normal  Uterus: Anteverted, normal size, shape and contour, midline and mobile nontender   Adnexa: Without masses or tenderness    Anus and perineum: Normal   Rectovaginal: Normal sphincter tone without palpated masses or tenderness.    Assessment/Plan:  51 y.o. G23P2002 female for annual exam without menses, vasectomy birth control.   1. Postmenopausal.  Patient is no longer having menses. Was having significant hot flushes and sweats. Was started on Paxil by her primary physician and has done well with good control of her symptoms. She does have a history of DVT 3 with Leiden factor V mutation. Continue to monitor for now. Call if any bleeding or other issues. 2. Mammography coming due in July and I reminded her to schedule this. SBE monthly reviewed. 3. Colonoscopy 2015. Repeat at their recommended interval. 4. Pap smear/HPV 08/2014 normal.  No Pap smear  done today. No history of significant abnormal Pap smears. Of note my progress note of last year set her Pap smear was 2016 but was 2015. Plan repeat Pap smear approaching 5 year interval from 2015. 5. DEXA. Patient relates that her daughter who works at a physical therapy office did a bone density on her and it was normal. 6. Health maintenance. No routine lab work done as patient relates this done elsewhere. Follow up 1 year, sooner as needed.   Anastasio Auerbach MD, 3:58 PM 02/25/2017

## 2017-02-27 ENCOUNTER — Ambulatory Visit (INDEPENDENT_AMBULATORY_CARE_PROVIDER_SITE_OTHER): Payer: BC Managed Care – PPO | Admitting: Physician Assistant

## 2017-02-27 ENCOUNTER — Ambulatory Visit (INDEPENDENT_AMBULATORY_CARE_PROVIDER_SITE_OTHER): Payer: Self-pay

## 2017-02-27 ENCOUNTER — Encounter (INDEPENDENT_AMBULATORY_CARE_PROVIDER_SITE_OTHER): Payer: Self-pay | Admitting: Physician Assistant

## 2017-02-27 DIAGNOSIS — M25572 Pain in left ankle and joints of left foot: Secondary | ICD-10-CM | POA: Diagnosis not present

## 2017-02-27 MED ORDER — METHYLPREDNISOLONE 4 MG PO TABS: ORAL_TABLET | ORAL | 0 refills | Status: DC

## 2017-02-27 NOTE — Progress Notes (Signed)
Office Visit Note   Patient: Nancy Simon           Date of Birth: 07-01-1966           MRN: [id number] Visit Date: 02/27/2017              Requested by: No referring provider defined for this encounter. PCP: Cyndi Bender, PA-C   Assessment & Plan: Visit Diagnoses:  1. Pain in left ankle and joints of left foot     Plan: Better shoe wear. Follow-up in 2 weeks check her progress lack of. We'll place her on a Medrol Dosepak. She will talk to her dermatologist before going on a [place]. Due to her squamous cell ulceration on the medial aspect is proximal to the medial malleolus would not place her in a ASO brace.  Follow-Up Instructions: Return in about 2 weeks (around 03/13/2017).   Orders:  Orders Placed This Encounter  Procedures  . XR Ankle Complete Left   Meds ordered this encounter  Medications  . methylPREDNISolone (MEDROL) 4 MG tablet    Sig: Take as directed    Dispense:  21 tablet    Refill:  0      Procedures: No procedures performed   Clinical Data: No additional findings.   Subjective: Chief Complaint  Patient presents with  . Left Ankle - Pain    HPI Sling Truman Hayward is a 51 year old female with with left ankle pain and swelling for at least last 3-1/2 weeks. 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 She's factor V Leiden mutation with a history of left leg DVT 23 at the age of 51 while pregnant knee other age 21. She is currently on Xarelto.  She's had no particular injury to the ankle. However she does report about 3-1/2 weeks ago she went on pain hike with her daughter in numbness drained and could've turned her ankle there she does note once pressed that her ankle pain began after the hike. Due to this squamous cell ulceration lower leg she's been wearing a lot of flip-flops nonsupportive shoes as these are most comfortable.  Review of Systems No fevers , chills,  chest pain or shortness of breath.  Objective: Vital Signs: LMP 09/04/2014   Physical Exam  Constitutional: She is oriented to person, place, and time. She appears well-developed and well-nourished. No distress.  Pulmonary/Chest: Effort normal.  Neurological: She is alert and oriented to person, place, and time.  Psychiatric: She has a normal mood and affect.   Ortho Exam Left calf supple nontender. Minimal tenderness of the left Achilles near the insertion. No erythema or edema in this area. Achilles intact. Minimal tenderness over the medial tubercle of the calcaneus left foot. She has good dorsiflexion plantar flexion ankle. Tenderness over the anterior medial aspect of the ankle with no tenderness over the EHL or anterior tibial tendon. No tenderness over the posterior tibial tendon. Now tendon left foot. She has 5 out of 5 strengths inversion eversion against resistance left foot. She has an ulceration distal medial left lower leg. Dorsal pedal pulses 2+.  Specialty Comments:  No specialty comments available.  Imaging: Xr Ankle Complete Left  Result Date: 02/27/2017 3 views left ankle: Talus well located within the ankle mortise no diastases. No bony abnormalities. No acute fracture. Soft tissue edema is present.    PMFS History: Patient Active Problem List  Diagnosis Date Noted  . Plantar fasciitis 03/08/2016  . Heel pain 03/08/2016  . Family hx of colon cancer 05/03/2013  . Factor 5 Leiden mutation, heterozygous ([place])   . DVT (deep vein thrombosis) in pregnancy [facility])    Past Medical History:  Diagnosis Date  . Allergy    SEASONAL  . Cancer (HCC)    Squamous cell carcinoma left ankle  . DVT (deep vein thrombosis) in pregnancy ([place]) 1996   right leg  . DVT (deep venous thrombosis) ([place]) 2013   Left Leg  . DVT of leg (deep venous thrombosis) ([place]) 2009   left leg  . Factor 5 Leiden mutation, heterozygous ([place])     Family History  Problem Relation Age of Onset  .  Colon cancer Mother   . Diabetes Mother   . Hypertension Mother   . Heart disease Mother     CHF  . Kidney failure Mother   . Heart disease Father     afib  . Clotting disorder Father   . Liver disease Father   . Leukemia Maternal Grandmother     Past Surgical History:  Procedure Laterality Date  . SKIN BIOPSY    . TONSILLECTOMY AND ADENOIDECTOMY    . TYMPANOPLASTY     Social History   Occupational History  . [employer]   Social History Main Topics  . Smoking status: Never Smoker  . Smokeless tobacco: Never Used  . Alcohol use 1.2 oz/week    2 Standard drinks or equivalent per week     Comment: occassionally, once or twice per week  . Drug use: No  . Sexual activity: Yes    Partners: Male    Birth control/ protection: Other-see comments     Comment: Vasectomy-1st intercourse 51 yo-Fewer than 5 partners

## 2017-03-13 ENCOUNTER — Ambulatory Visit (INDEPENDENT_AMBULATORY_CARE_PROVIDER_SITE_OTHER): Payer: BC Managed Care – PPO | Admitting: Physician Assistant

## 2017-04-16 ENCOUNTER — Encounter: Payer: Self-pay | Admitting: Vascular Surgery

## 2017-04-16 ENCOUNTER — Other Ambulatory Visit: Payer: Self-pay | Admitting: Gynecology

## 2017-04-16 DIAGNOSIS — Z1231 Encounter for screening mammogram for malignant neoplasm of breast: Secondary | ICD-10-CM

## 2017-04-29 ENCOUNTER — Encounter: Payer: Self-pay | Admitting: Vascular Surgery

## 2017-04-29 ENCOUNTER — Ambulatory Visit (INDEPENDENT_AMBULATORY_CARE_PROVIDER_SITE_OTHER): Payer: BC Managed Care – PPO | Admitting: Vascular Surgery

## 2017-04-29 VITALS — BP 141/89 | HR 62 | Temp 98.1°F | Resp 16 | Ht 64.5 in | Wt 157.0 lb

## 2017-04-29 DIAGNOSIS — L97929 Non-pressure chronic ulcer of unspecified part of left lower leg with unspecified severity: Secondary | ICD-10-CM | POA: Diagnosis not present

## 2017-04-29 DIAGNOSIS — I83029 Varicose veins of left lower extremity with ulcer of unspecified site: Secondary | ICD-10-CM

## 2017-04-29 NOTE — Progress Notes (Signed)
[facility] [place]  Patient name: Nancy Simon MRN: [id number] DOB: 02/25/1966 Sex: female  REASON FOR VISIT: Continued follow-up of venous ulcer left lower extremity.  HPI: Nancy Simon is a 50 y.o. female here for follow-up. She saw Dr. Bridgett Larsson 3 months ago. She did have a ulceration on her medial malleolus area and this turned out to be squamous cell carcinoma which was treated with Mohs surgery. She's had a very difficult time healing this. Her ulcer that looks like a typical venous ulcer currently. She does have a history of factor V Leiden deficiency and is on chronic anticoagulation for this. Has 3 prior history of deep venous thromboses in her lower extremities. She reports that she has been very compliant with her compression. She still has pain at the end of the day of standing. 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  Past Medical History:  Diagnosis Date  . Allergy    SEASONAL  . Cancer (HCC)    Squamous cell carcinoma left ankle  . DVT (deep vein thrombosis) in pregnancy ([place]) 1996   right leg  . DVT (deep venous thrombosis) ([place]) 2013   Left Leg  . DVT of leg (deep venous thrombosis) ([place]) 2009   left leg  . Factor 5 Leiden mutation, heterozygous ([place])     Family History  Problem Relation Age of Onset  . Colon cancer Mother   . Diabetes Mother   . Hypertension Mother   . Heart disease Mother        CHF  . Kidney failure Mother   . Heart disease Father        afib  . Clotting disorder Father   . Liver disease Father   . Leukemia Maternal Grandmother     SOCIAL HISTORY: Social History  Substance Use Topics  . Smoking status: Never Smoker  . Smokeless tobacco: Never Used  . Alcohol use 1.2 oz/week    2 Standard drinks or equivalent per week     Comment: occassionally, once or twice per week    Allergies  Allergen Reactions  . Flagyl [Metronidazole Hcl]     Can't take with coumadin.     Current Outpatient Prescriptions  Medication Sig Dispense Refill  . fexofenadine (ALLEGRA) 60 MG tablet Take 60 mg by mouth daily as needed for rhinitis.     . methylPREDNISolone (MEDROL) 4 MG tablet Take as directed 21 tablet 0  . PARoxetine (PAXIL) 10 MG tablet Take 10 mg by mouth daily.    . Rivaroxaban (XARELTO) 15 MG TABS tablet Take 15 mg by mouth at bedtime.     No current facility-administered medications for this visit.     REVIEW OF SYSTEMS:  [X]  denotes positive finding, [ ]  denotes negative finding Cardiac  Comments:  Chest pain or chest pressure:    Shortness of breath upon exertion:    Short of breath when lying flat:    Irregular heart rhythm:        Vascular    Pain in calf, thigh, or hip brought on by ambulation:    Pain in feet at night that wakes you up from your sleep:     Blood clot in your veins: x   Leg swelling:  x         PHYSICAL EXAM: Vitals:   04/29/17 1440 04/29/17 1444  BP: (!) 142/99 (!) 141/89  Pulse: 62   Resp: 16   Temp: 98.1 F (36.7  C)   TempSrc: Oral   SpO2: 99%   Weight: 157 lb (71.2 kg)   Height: 5' 4.5" (1.638 m)     GENERAL: The patient is a well-nourished female, in no acute distress. The vital signs are documented above. CARDIOVASCULAR: 2+ radial and 2+ dorsalis pedis pulses bilaterally. PULMONARY: There is good air exchange  MUSCULOSKELETAL: There are no major deformities or cyanosis. NEUROLOGIC: No focal weakness or paresthesias are detected. SKIN: 2-3 cm ulceration with a slow granulating base and the medial left ankle. PSYCHIATRIC: The patient has a normal affect. Large venous varicosities on her left leg over the pretibial area and calf DATA:  I reviewed her formal venous duplex from her initial visit showing reflux throughout her great saphenous vein on the left with the vein dilated in excess of 6-7 cm throughout its course. She had reflux at the saphenofemoral junction with the vein diameter of 0.97  cm.  MEDICAL ISSUES: I feel that she is having a very difficult time healing this area of ulceration due to her severe venous hypertension. I have recommended laser ablation of her left great saphenous vein and stab phlebectomy of multiple tributary varicosities. I explained that she would be at slight increased risk for DVT but this is extremely uncommon with this procedure. Would recommend holding her anticoagulation for 2 days and then proceeding with the ablation and stab phlebectomy and immediately resuming her anticoagulation. Explained this would be done on Thursday morning and she would be out of work Thursday and Friday and then able to resume following this. Explained that she would have a formal venous duplex one week following the procedure to rule out DVT. She wishes to proceed as soon as we have obtained insurance preapproval    Rosetta Posner, MD [facility] Tel [phone] Pager [phone]

## 2017-05-05 ENCOUNTER — Other Ambulatory Visit: Payer: Self-pay | Admitting: *Deleted

## 2017-05-05 DIAGNOSIS — I83812 Varicose veins of left lower extremities with pain: Secondary | ICD-10-CM

## 2017-05-06 ENCOUNTER — Ambulatory Visit: Payer: BC Managed Care – PPO | Admitting: Vascular Surgery

## 2017-05-21 ENCOUNTER — Ambulatory Visit
Admission: RE | Admit: 2017-05-21 | Discharge: 2017-05-21 | Disposition: A | Discharge status: Home / Self Care | Payer: BC Managed Care – PPO | Source: Ambulatory Visit | Attending: Gynecology | Admitting: Gynecology

## 2017-05-21 DIAGNOSIS — Z1231 Encounter for screening mammogram for malignant neoplasm of breast: Secondary | ICD-10-CM

## 2017-06-10 ENCOUNTER — Encounter: Payer: Self-pay | Admitting: Vascular Surgery

## 2017-06-19 ENCOUNTER — Other Ambulatory Visit: Payer: BC Managed Care – PPO | Admitting: Vascular Surgery

## 2017-06-26 ENCOUNTER — Encounter (HOSPITAL_COMMUNITY): Payer: BC Managed Care – PPO

## 2017-06-26 ENCOUNTER — Ambulatory Visit: Payer: BC Managed Care – PPO | Admitting: Vascular Surgery

## 2017-07-24 ENCOUNTER — Encounter: Payer: Self-pay | Admitting: Vascular Surgery

## 2017-07-24 ENCOUNTER — Ambulatory Visit (INDEPENDENT_AMBULATORY_CARE_PROVIDER_SITE_OTHER): Payer: BC Managed Care – PPO | Admitting: Vascular Surgery

## 2017-07-24 VITALS — BP 129/75 | HR 65 | Temp 97.6°F | Resp 16 | Ht 65.0 in | Wt 160.0 lb

## 2017-07-24 DIAGNOSIS — L97929 Non-pressure chronic ulcer of unspecified part of left lower leg with unspecified severity: Secondary | ICD-10-CM | POA: Diagnosis not present

## 2017-07-24 DIAGNOSIS — I83029 Varicose veins of left lower extremity with ulcer of unspecified site: Secondary | ICD-10-CM | POA: Diagnosis not present

## 2017-07-24 NOTE — Progress Notes (Signed)
     Laser Ablation Procedure    Date: 07/24/2017   Nancy Simon DOB:1966/09/10  Consent signed: Yes    Surgeon:  Dr. Sherren Mocha Whitni Pasquini  Procedure: Laser Ablation: left Greater Saphenous Vein  BP 129/75   Pulse 65   Temp 97.6 F (36.4 C)   Resp 16   Ht 5\' 5"  (1.651 m)   Wt 160 lb (72.6 kg)   LMP 09/04/2014   SpO2 99%   BMI 26.63 kg/m   Tumescent Anesthesia: 475 cc 0.9% NaCl with 50 cc Lidocaine HCL with 1% Epi and 15 cc 8.4% NaHCO3  Local Anesthesia: 5 cc Lidocaine HCL and NaHCO3 (ratio 2:1)  15 watts continuous mode        Total energy: 2335   Total time: 2:34    Stab Phlebectomy: 10-20 Sites: Calf and Ankle  Patient tolerated procedure well  Notes:   Description of Procedure:  After marking the course of the secondary varicosities, the patient was placed on the operating table in the supine position, and the left leg was prepped and draped in sterile fashion.   Local anesthetic was administered and under ultrasound guidance the saphenous vein was accessed with a micro needle and guide wire; then the mirco puncture sheath was placed.  A guide wire was inserted saphenofemoral junction , followed by a 5 french sheath.  The position of the sheath and then the laser fiber below the junction was confirmed using the ultrasound.  Tumescent anesthesia was administered along the course of the saphenous vein using ultrasound guidance. The patient was placed in Trendelenburg position and protective laser glasses were placed on patient and staff, and the laser was fired at 15 watts continuous mode advancing 1-60mm/second for a total of 2335 joules.   For stab phlebectomies, local anesthetic was administered at the previously marked varicosities, and tumescent anesthesia was administered around the vessels.  Ten to 20 stab wounds were made using the tip of an 11 blade. And using the vein hook, the phlebectomies were performed using a hemostat to avulse the varicosities.  Adequate hemostasis  was achieved.     Steri strips were applied to the stab wounds and ABD pads and thigh high compression stockings were applied.  Ace wrap bandages were applied over the phlebectomy sites and at the top of the saphenofemoral junction. Blood loss was less than 15 cc.  The patient ambulated out of the operating room having tolerated the procedure well.

## 2017-07-31 ENCOUNTER — Encounter: Payer: Self-pay | Admitting: Vascular Surgery

## 2017-07-31 ENCOUNTER — Ambulatory Visit (HOSPITAL_COMMUNITY)
Admission: RE | Admit: 2017-07-31 | Discharge: 2017-07-31 | Disposition: A | Discharge status: Home / Self Care | Payer: BC Managed Care – PPO | Source: Ambulatory Visit | Attending: Vascular Surgery | Admitting: Vascular Surgery

## 2017-07-31 ENCOUNTER — Ambulatory Visit (INDEPENDENT_AMBULATORY_CARE_PROVIDER_SITE_OTHER): Payer: Self-pay | Admitting: Vascular Surgery

## 2017-07-31 VITALS — BP 100/72 | HR 66 | Temp 98.1°F | Resp 18 | Ht 65.0 in | Wt 160.0 lb

## 2017-07-31 DIAGNOSIS — I83812 Varicose veins of left lower extremities with pain: Secondary | ICD-10-CM | POA: Diagnosis not present

## 2017-07-31 DIAGNOSIS — Z9889 Other specified postprocedural states: Secondary | ICD-10-CM | POA: Insufficient documentation

## 2017-07-31 DIAGNOSIS — L97929 Non-pressure chronic ulcer of unspecified part of left lower leg with unspecified severity: Secondary | ICD-10-CM

## 2017-07-31 DIAGNOSIS — I83029 Varicose veins of left lower extremity with ulcer of unspecified site: Secondary | ICD-10-CM

## 2017-07-31 NOTE — Progress Notes (Signed)
   Patient name: Nancy Simon MRN: [id number] DOB: [birth year] Sex: female  REASON FOR VISIT: One-week follow-up left great saphenous vein ablation and multiple stab phlebectomy to tributary varicosities  HPI: Nancy Simon is a 51 y.o. female here today for follow-up. She reports that she had minimal discomfort for several days but on postprocedure day #3 she had the more significant pain in the distal medial thigh and this has persisted. She does have mild bruising in this area but no significant swelling. She's had minimal if any pain associated with her stab phlebectomy. She did resume her Xarelto around the same time frame. Has been unable to take ibuprofen due to this.  Current Outpatient Prescriptions  Medication Sig Dispense Refill  . fexofenadine (ALLEGRA) 60 MG tablet Take 60 mg by mouth daily as needed for rhinitis.     Marland Kitchen PARoxetine (PAXIL) 10 MG tablet Take 10 mg by mouth daily.    . Rivaroxaban (XARELTO) 15 MG TABS tablet Take 15 mg by mouth at bedtime.    . methylPREDNISolone (MEDROL) 4 MG tablet Take as directed (Patient not taking: Reported on 07/31/2017) 21 tablet 0   No current facility-administered medications for this visit.      PHYSICAL EXAM: Vitals:   07/31/17 1006  BP: 100/72  Pulse: 66  Resp: 18  Temp: 98.1 F (36.7 C)  TempSrc: Oral  SpO2: 99%  Weight: 160 lb (72.6 kg)  Height: 5\' 5"  (1.651 m)    GENERAL: The patient is a well-nourished female, in no acute distress. The vital signs are documented above. Mild bruising on the medial left thigh. No evidence of wound issues with her stab phlebectomy.  Duplex today reveals a great saphenous vein from the mid calf to 1 cm below her saphenofemoral junction and no evidence of DVT  MEDICAL ISSUES: Stable one-week follow-up. Is having more than typical discomfort. Reassured her that this is not uncommon and not a sign of any danger. She will continue to increase her activities as  tolerated and will wear compression for one additional week. She will notify us if she continues to have difficulties otherwise will see [place] on as-needed basis   Rosetta Posner, MD [facility] Tel [phone] Pager [phone]

## 2018-03-19 ENCOUNTER — Encounter: Payer: Self-pay | Admitting: Gynecology

## 2018-03-19 ENCOUNTER — Ambulatory Visit: Payer: BC Managed Care – PPO | Admitting: Gynecology

## 2018-03-19 VITALS — BP 118/74 | Ht 64.5 in | Wt 166.0 lb

## 2018-03-19 DIAGNOSIS — Z01411 Encounter for gynecological examination (general) (routine) with abnormal findings: Secondary | ICD-10-CM | POA: Diagnosis not present

## 2018-03-19 DIAGNOSIS — N950 Postmenopausal bleeding: Secondary | ICD-10-CM

## 2018-03-19 DIAGNOSIS — N952 Postmenopausal atrophic vaginitis: Secondary | ICD-10-CM

## 2018-03-19 NOTE — Progress Notes (Signed)
    Nancy Simon January 11, 1966 [id number]        51 y.o.  G2P2002 for annual gynecologic exam.  Notes episode of bleeding 3 months ago.  While on toilet she noticed blood when wiping and also dripping into the bowl.  No significant discomfort associated with this.  Has not occurred since then.  Prior bleeding episode 3 years prior with her LMP.  Is having issues with hot flushes and sweats and was started on Paxil by her primary physician which helps with her symptoms.  Past medical history,surgical history, problem list, medications, allergies, family history and social history were all reviewed and documented as reviewed in the EPIC chart.  ROS:  Performed with pertinent positives and negatives included in the history, assessment and plan.   Additional significant findings : None   Exam: Caryn Bee assistant Vitals:   03/19/18 1532  BP: 118/74  Weight: 166 lb (75.3 kg)  Height: 5' 4.5" (1.638 m)   Body mass index is 28.05 kg/m.  General appearance:  Normal affect, orientation and appearance. Skin: Grossly normal HEENT: Without gross lesions.  No cervical or supraclavicular adenopathy. Thyroid normal.  Lungs:  Clear without wheezing, rales or rhonchi Cardiac: RR, without RMG Abdominal:  Soft, nontender, without masses, guarding, rebound, organomegaly or hernia Breasts:  Examined lying and sitting without masses, retractions, discharge or axillary adenopathy. Pelvic:  Ext, BUS, Vagina: With atrophic changes  Cervix: With atrophic changes  Uterus: Anteverted, normal size, shape and contour, midline and mobile nontender   Adnexa: Without masses or tenderness    Anus and perineum: Normal   Rectovaginal: Normal sphincter tone without palpated masses or tenderness.    Assessment/Plan:  52 y.o. G49P2002 female for annual gynecologic exam.   1. Postmenopausal bleeding.  Had been 3 years since LMP and then had episode of bleeding x1 day.  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   No significant pain or bleeding.  We discussed differential to include escape ovulation, atrophic changes, structural such as polyps, hyperplastic/precancerous and endometrial cancer.  Whether based on one episode of bleeding to pursue more involved evaluation versus observation and follow-up if recurs discussed.  Ultimately we decided to proceed with ultrasound now.  If thin endometrium then will stop with this for now.  If thicker or any other issues then we will proceed with full sonohysterogram. 2. Mammography 05/2017.  Continue with annual mammography this coming summer.  Breast exam normal today. 3. Pap smear/HPV 08/2014.  No Pap smear done today.  No history of significant abnormal Pap smears.  Plan repeat Pap smear/HPV next year at 5-year interval per current screening guidelines. 4. Colonoscopy 2015.  Repeat at their recommended interval. 5. Health maintenance.  No routine lab work done as patient reports this done elsewhere.  Follow-up for sonohysterogram.  Follow-up in 1 year for annual exam.   Anastasio Auerbach MD, 4:04 PM 03/19/2018

## 2018-03-19 NOTE — Patient Instructions (Signed)
Follow up for ultrasound as scheduled 

## 2018-03-31 ENCOUNTER — Other Ambulatory Visit: Payer: Self-pay | Admitting: Gynecology

## 2018-03-31 DIAGNOSIS — N950 Postmenopausal bleeding: Secondary | ICD-10-CM

## 2018-04-13 ENCOUNTER — Ambulatory Visit (INDEPENDENT_AMBULATORY_CARE_PROVIDER_SITE_OTHER): Payer: BC Managed Care – PPO

## 2018-04-13 ENCOUNTER — Ambulatory Visit: Payer: BC Managed Care – PPO | Admitting: Gynecology

## 2018-04-13 ENCOUNTER — Encounter: Payer: Self-pay | Admitting: Gynecology

## 2018-04-13 DIAGNOSIS — N950 Postmenopausal bleeding: Secondary | ICD-10-CM

## 2018-04-13 NOTE — Patient Instructions (Signed)
Follow-up when due for annual exam next year.  Call if any further bleeding.

## 2018-04-13 NOTE — Progress Notes (Signed)
    TYTEANNA OST 1966/05/13 [id number]        52 y.o.  G2P2002 presents for sonohysterogram due to episode of postmenopausal bleeding.  It has been 3 years since her LMP and she had an episode of bleeding like a regular menses that lasted about a day.  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  No bleeding since.  No pain with the bleeding.  Past medical history,surgical history, problem list, medications, allergies, family history and social history were all reviewed and documented in the EPIC chart.  Directed ROS with pertinent positives and negatives documented in the history of present illness/assessment and plan.  Exam: General appearance:  Normal  Ultrasound transvaginal shows uterus normal size and echotexture.  Endometrial echo 4.0 mm normal in appearance.  Right and left ovaries normal.  Cul-de-sac negative.  Assessment/Plan:  52 y.o. E3O1224 with single episode of bleeding on Xarelto.  Endometrial echo thin on ultrasound.  Discussed whether to proceed with endometrial assessment with sonohysterogram and biopsy.  Given the total picture we both feel comfortable at this point monitoring.  If she has any recurrent bleeding she will call.  If she remains without bleeding then will follow expectantly.  She understands the issues of missed pathology although unlikely given the thin endometrium on ultrasound.    Anastasio Auerbach MD, 2:41 PM 04/13/2018

## 2018-04-15 NOTE — Addendum Note (Signed)
Addended by: Alen Blew on: 04/15/2018 12:08 PM   Modules accepted: Level of Service

## 2018-04-28 ENCOUNTER — Other Ambulatory Visit: Payer: Self-pay | Admitting: Gynecology

## 2018-04-28 DIAGNOSIS — Z1231 Encounter for screening mammogram for malignant neoplasm of breast: Secondary | ICD-10-CM

## 2018-05-27 ENCOUNTER — Ambulatory Visit
Admission: RE | Admit: 2018-05-27 | Discharge: 2018-05-27 | Disposition: A | Discharge status: Home / Self Care | Payer: BC Managed Care – PPO | Source: Ambulatory Visit | Attending: Gynecology | Admitting: Gynecology

## 2018-05-27 DIAGNOSIS — Z1231 Encounter for screening mammogram for malignant neoplasm of breast: Secondary | ICD-10-CM

## 2019-05-05 ENCOUNTER — Other Ambulatory Visit: Payer: Self-pay | Admitting: Gynecology

## 2019-05-05 DIAGNOSIS — Z1231 Encounter for screening mammogram for malignant neoplasm of breast: Secondary | ICD-10-CM

## 2019-05-14 ENCOUNTER — Other Ambulatory Visit: Payer: Self-pay

## 2019-05-17 ENCOUNTER — Other Ambulatory Visit: Payer: Self-pay

## 2019-05-17 ENCOUNTER — Ambulatory Visit: Payer: BC Managed Care – PPO | Admitting: Gynecology

## 2019-05-17 ENCOUNTER — Encounter: Payer: Self-pay | Admitting: Gynecology

## 2019-05-17 VITALS — BP 118/76 | Ht 64.5 in | Wt 177.0 lb

## 2019-05-17 DIAGNOSIS — Z1151 Encounter for screening for human papillomavirus (HPV): Secondary | ICD-10-CM

## 2019-05-17 DIAGNOSIS — Z01419 Encounter for gynecological examination (general) (routine) without abnormal findings: Secondary | ICD-10-CM

## 2019-05-17 NOTE — Patient Instructions (Signed)
Follow-up for your mammogram and your colonoscopy as you are planning.  Follow-up in 1 year for annual exam, sooner as needed.

## 2019-05-17 NOTE — Progress Notes (Signed)
    JENYA PUTZ 01/20/1966 [id number]        53 y.o.  G2P2002 for annual gynecologic exam.  Having some hot flushes and sweats.  Was started on Paxil by her primary physician and notes some relief of her symptoms.  Past medical history,surgical history, problem list, medications, allergies, family history and social history were all reviewed and documented as reviewed in the EPIC chart.  ROS:  Performed with pertinent positives and negatives included in the history, assessment and plan.   Additional significant findings : None   Exam: Caryn Bee assistant Vitals:   05/17/19 1132  BP: 118/76  Weight: 177 lb (80.3 kg)  Height: 5' 4.5" (1.638 m)   Body mass index is 29.91 kg/m.  General appearance:  Normal affect, orientation and appearance. Skin: Grossly normal HEENT: Without gross lesions.  No cervical or supraclavicular adenopathy. Thyroid normal.  Lungs:  Clear without wheezing, rales or rhonchi Cardiac: RR, without RMG Abdominal:  Soft, nontender, without masses, guarding, rebound, organomegaly or hernia Breasts:  Examined lying and sitting without masses, retractions, discharge or axillary adenopathy. Pelvic:  Ext, BUS, Vagina: Normal  Cervix: Normal.  Pap smear/HPV  Uterus: Anteverted, normal size, shape and contour, midline and mobile nontender   Adnexa: Without masses or tenderness    Anus and perineum: Normal   Rectovaginal: Normal sphincter tone without palpated masses or tenderness.    Assessment/Plan:  53 y.o. G37P2002 female for annual gynecologic exam.   1. Postmenopausal.  Having some menopausal symptoms with hot flushes and sweats.  Was started on Paxil by her primary and now is down to 5 mg at bedtime and notes relief of her symptoms.  Had episode of bleeding last year on Xarelto with her history of DVTs.  Endometrial echo was thin.  She is done no bleeding since.  Will call if she has any further bleeding. 2. Mammography scheduled and she will follow-up for  this.  Breast exam normal today. 3. Pap smear/HPV 2015.  Pap smear/HPV today.  No history of significant abnormal Pap smears.  Continue with every five-year Pap smear/HPV per current screening guidelines. 4. Colonoscopy 2015.  Is in the process of arranging this coming year due to maternal history of colon cancer doing every 5 years.  She will follow-up for this. 5. Health maintenance.  No routine lab work done as she has this done at her primary physician's office.  Follow-up 1 year, sooner as needed.   Anastasio Auerbach MD, 11:56 AM 05/17/2019

## 2019-05-17 NOTE — Addendum Note (Signed)
Addended by: Nelva Nay on: 05/17/2019 12:11 PM   Modules accepted: Orders

## 2019-05-19 LAB — PAP IG AND HPV HIGH-RISK: HPV DNA High Risk: NOT DETECTED

## 2019-06-15 ENCOUNTER — Ambulatory Visit: Payer: BC Managed Care – PPO

## 2019-07-28 ENCOUNTER — Other Ambulatory Visit: Payer: Self-pay

## 2019-07-28 ENCOUNTER — Ambulatory Visit
Admission: RE | Admit: 2019-07-28 | Discharge: 2019-07-28 | Disposition: A | Discharge status: Home / Self Care | Payer: BC Managed Care – PPO | Source: Ambulatory Visit | Attending: Gynecology | Admitting: Gynecology

## 2019-07-28 ENCOUNTER — Encounter: Payer: Self-pay | Admitting: Gynecology

## 2019-07-28 DIAGNOSIS — Z1231 Encounter for screening mammogram for malignant neoplasm of breast: Secondary | ICD-10-CM

## 2019-07-30 ENCOUNTER — Other Ambulatory Visit: Payer: Self-pay | Admitting: Gynecology

## 2019-07-30 DIAGNOSIS — R928 Other abnormal and inconclusive findings on diagnostic imaging of breast: Secondary | ICD-10-CM

## 2019-08-02 ENCOUNTER — Other Ambulatory Visit: Payer: Self-pay

## 2019-08-02 ENCOUNTER — Ambulatory Visit
Admission: RE | Admit: 2019-08-02 | Discharge: 2019-08-02 | Disposition: A | Discharge status: Home / Self Care | Payer: BC Managed Care – PPO | Source: Ambulatory Visit | Attending: Gynecology | Admitting: Gynecology

## 2019-08-02 DIAGNOSIS — R928 Other abnormal and inconclusive findings on diagnostic imaging of breast: Secondary | ICD-10-CM

## 2020-02-17 ENCOUNTER — Encounter: Payer: Self-pay | Admitting: *Deleted

## 2020-02-24 ENCOUNTER — Ambulatory Visit: Payer: BC Managed Care – PPO | Admitting: Internal Medicine

## 2020-03-08 ENCOUNTER — Ambulatory Visit: Payer: BC Managed Care – PPO | Admitting: Internal Medicine

## 2020-03-27 ENCOUNTER — Encounter: Payer: Self-pay | Admitting: Internal Medicine

## 2020-03-27 ENCOUNTER — Ambulatory Visit: Payer: BC Managed Care – PPO | Admitting: Internal Medicine

## 2020-03-27 ENCOUNTER — Other Ambulatory Visit: Payer: Self-pay | Admitting: Internal Medicine

## 2020-03-27 VITALS — BP 104/64 | HR 63 | Ht 64.5 in | Wt 174.0 lb

## 2020-03-27 DIAGNOSIS — Z800 Family history of malignant neoplasm of digestive organs: Secondary | ICD-10-CM | POA: Diagnosis not present

## 2020-03-27 DIAGNOSIS — Z7901 Long term (current) use of anticoagulants: Secondary | ICD-10-CM | POA: Diagnosis not present

## 2020-03-27 DIAGNOSIS — K5900 Constipation, unspecified: Secondary | ICD-10-CM

## 2020-03-27 DIAGNOSIS — D6851 Activated protein C resistance: Secondary | ICD-10-CM | POA: Diagnosis not present

## 2020-03-27 MED ORDER — SUPREP BOWEL PREP KIT 17.5-3.13-1.6 GM/177ML PO SOLN: 1.0000 | ORAL | 0 refills | Status: DC

## 2020-03-27 NOTE — Progress Notes (Signed)
Patient ID: BELLAMI ERNSTER, female   DOB: 12-Feb-1966, 54 y.o.   MRN: [id number] HPI: Rendi Clemen is a 54 year old female with a history of factor V Leiden and personal history of DVT on Xarelto, family history of colon cancer in her mother at age 59 who is seen to discuss screening colonoscopy.  She is here alone today.  She is known to me from prior evaluation of gastritis found to be related to aspirin use and resolving after aspirin was discontinued.  I performed a screening colonoscopy for her on 01/31/2014 which was normal.  She reports that she had some mild constipation earlier this year and she took a stool softener in the form of docusate 100 to 200 mg daily with success.  She was using biotin for thinning hair and for nail health and she feels that this also exacerbated her constipation.  Off of biotin bowel movements are regular.  No blood in stool or melena.  No abdominal pain.  No upper GI or hepatobiliary complaint.  Her mother had colorectal cancer at age 37.  Her 2 brothers have had polyps at their screening colonoscopies.  Past Medical History:  Diagnosis Date  . Allergy    SEASONAL  . Cancer (HCC)    Squamous cell carcinoma left ankle  . DVT (deep vein thrombosis) in pregnancy    right leg  . DVT (deep venous thrombosis) ([place]) 2013   Left Leg  . DVT of leg (deep venous thrombosis) ([place]) 2009   left leg  . Factor 5 Leiden mutation, heterozygous ([place])   . Hiatal hernia     Past Surgical History:  Procedure Laterality Date  . SKIN BIOPSY    . TONSILLECTOMY AND ADENOIDECTOMY    . TYMPANOPLASTY    . VEIN SURGERY      Outpatient Medications Prior to Visit  Medication Sig Dispense Refill  . BIOTIN PO Take by mouth every other day.    . cholecalciferol (VITAMIN D3) 25 MCG (1000 UNIT) tablet Take 1,000 Units by mouth in the morning, at noon, and at bedtime.     . fexofenadine (ALLEGRA) 60 MG tablet Take 60 mg by mouth daily as needed for rhinitis.     Marland Kitchen PARoxetine  (PAXIL) 10 MG tablet Take 5 mg by mouth daily.     Alveda Reasons 20 MG TABS tablet Take 20 mg by mouth daily.    . Rivaroxaban (XARELTO) 15 MG TABS tablet Take 15 mg by mouth at bedtime.     No facility-administered medications prior to visit.    Allergies  Allergen Reactions  . Flagyl [Metronidazole Hcl]     Can't take with coumadin.    Family History  Problem Relation Age of Onset  . Colon cancer Mother   . Diabetes Mother   . Hypertension Mother   . Heart disease Mother        CHF  . Kidney failure Mother   . Heart disease Father        afib  . Clotting disorder Father   . Liver disease Father   . Leukemia Maternal Grandmother   . Breast cancer Neg Hx     Social History   Tobacco Use  . Smoking status: Never Smoker  . Smokeless tobacco: Never Used  Substance Use Topics  . Alcohol use: Yes    Alcohol/week: 2.0 standard drinks    Types: 2 Standard drinks or equivalent per week    Comment: occassionally, once or twice per week  .  Drug use: No    ROS: As per history of present illness, otherwise negative  BP 104/64   Pulse 63   Ht 5' 4.5" (1.638 m)   Wt 174 lb (78.9 kg)   LMP 09/04/2014   SpO2 99%   BMI 29.41 kg/m  Gen: awake, alert, NAD HEENT: anicteric, op clear CV: RRR, no mrg Pulm: CTA b/l Abd: soft, NT/ND, +BS throughout Ext: no c/c/e Neuro: nonfocal   RELEVANT LABS AND IMAGING: CBC    Component Value Date/Time   WBC 7.8 09/19/2013 0047   RBC 4.28 09/19/2013 0047   HGB 12.2 09/19/2013 0047   HGB 13.0 06/06/2010 1133   HCT 35.9 (L) 09/19/2013 0047   HCT 36.8 06/06/2010 1133   PLT 323 09/19/2013 0047   PLT 287 06/06/2010 1133   MCV 83.9 09/19/2013 0047   MCV 90 06/06/2010 1133   MCH 28.5 09/19/2013 0047   MCHC 34.0 09/19/2013 0047   RDW 13.5 09/19/2013 0047   RDW 11.7 06/06/2010 1133   LYMPHSABS 3.5 09/19/2013 0047   LYMPHSABS 2.2 06/06/2010 1133   MONOABS 0.7 09/19/2013 0047   EOSABS 0.1 09/19/2013 0047   EOSABS 0.1 06/06/2010 1133    BASOSABS 0.0 09/19/2013 0047   BASOSABS 0.0 06/06/2010 1133    CMP     Component Value Date/Time   NA 139 09/19/2013 0047   K 3.7 09/19/2013 0047   CL 103 09/19/2013 0047   CO2 28 09/19/2013 0047   GLUCOSE 98 09/19/2013 0047   BUN 14 09/19/2013 0047   CREATININE 0.74 09/19/2013 0047   CREATININE 0.66 07/01/2011 1626   CALCIUM 10.7 (H) 09/19/2013 0047   PROT 7.1 09/19/2013 0047   ALBUMIN 4.2 09/19/2013 0047   AST 19 09/19/2013 0047   ALT 14 09/19/2013 0047   ALKPHOS 32 (L) 09/19/2013 0047   BILITOT 0.4 09/19/2013 0047   GFRNONAA >90 09/19/2013 0047   GFRAA >90 09/19/2013 0047    ASSESSMENT/PLAN: 54 year old female with a history of factor V Leiden and personal history of DVT on Xarelto, family history of colon cancer in her mother at age 82 who is seen to discuss screening colonoscopy.  1.  Family history of colon cancer --her mother had colon cancer at age 56.  This warrants screening colonoscopy every 5 years.  I recommended colonoscopy at this time and we discussed the risk, benefits and alternatives.  She is agreeable and wishes to proceed. --Colonoscopy in the [place]  2.  Chronic Xarelto/factor V Leiden -- Will hold Xarelto 2 days prior to endoscopic procedures - will instruct when and how to resume after procedure. Benefits and risks of procedure explained including risks of bleeding, perforation, infection, missed lesions, reactions to medications and possible need for hospitalization and surgery for complications. Additional rare but real risk of stroke or other vascular clotting events off Xarelto also explained and need to seek urgent help if any signs of these problems occur. Will communicate by phone or EMR with patient's  prescribing provider to confirm that holding Xarelto is reasonable in this case.   3.  Mild constipation --intermittent and less of a problem now.  She can use Colace safely 100 to 200 mg daily as needed       [id number], Ovid Curd, Pa-c [address] [place],  [place] [address]

## 2020-03-27 NOTE — Patient Instructions (Signed)
Please purchase the following medications over the counter and take as directed: Colace-use as needed for constipation  You have been scheduled for a colonoscopy. Please follow written instructions given to you at your visit today.  Please pick up your prep supplies at the pharmacy within the next 1-3 days. If you use inhalers (even only as needed), please bring them with you on the day of your procedure. Your physician has requested that you go to www.startemmi.com and enter the access code given to you at your visit today. This web site gives a general overview about your procedure. However, you should still follow specific instructions given to you by our office regarding your preparation for the procedure.   If you are age 84 or older, your body mass index should be between 23-30. Your Body mass index is 29.41 kg/m. If this is out of the aforementioned range listed, please consider follow up with your Primary Care Provider.  If you are age 29 or younger, your body mass index should be between 19-25. Your Body mass index is 29.41 kg/m. If this is out of the aformentioned range listed, please consider follow up with your Primary Care Provider.   Due to recent changes in healthcare laws, you may see the results of your imaging and laboratory studies on MyChart before your provider has had a chance to review them.  We understand that in some cases there may be results that are confusing or concerning to you. Not all laboratory results come back in the same time frame and the provider may be waiting for multiple results in order to interpret others.  Please give [place] 48 hours in order for your provider to thoroughly review all the results before contacting the office for clarification of your results.

## 2020-04-21 ENCOUNTER — Telehealth: Payer: Self-pay | Admitting: Internal Medicine

## 2020-04-21 NOTE — Telephone Encounter (Signed)
I have spoken to patient. She indicates that she cannot afford the suprep as insurance does not cover. She cannot take the tablets because they are "just too big." I have attempted prior authorization through insurance on [email or web address]. However, it appears that her insurance may only cover clenpiq. I am currently awaiting insurance response.

## 2020-04-21 NOTE — Telephone Encounter (Signed)
Pt called stating that her insurance does not cover prep. She would like to have a different one, maybe the one that she had on her previous colon. Pt doe snot want to have sutab.

## 2020-04-24 ENCOUNTER — Telehealth: Payer: Self-pay | Admitting: *Deleted

## 2020-04-24 MED ORDER — CLENPIQ 10-3.5-12 MG-GM -GM/160ML PO SOLN: 1.0000 | ORAL | 0 refills | Status: DC

## 2020-04-24 NOTE — Telephone Encounter (Signed)
Patient's insurance has denied Suprep. I have spoken to Dr Hilarie Fredrickson who has okayed patient to have Clenpiq, insurance preference. I have sent the prescription to pharmacy and have created new instructions. In addition to that, I have left a message for patient to call back. I need her to sign up for mychart so she can get updated clenpiq instructions and I also need her to let me know who is prescribing her Xarelto. I originally thought it was Cyndi Bender, PA-C, however, it appears he is no longer in the system.

## 2020-04-24 NOTE — Telephone Encounter (Signed)
   Nancy Simon 06-09-1966 [id number]  Dear Cyndi Bender, PA-C:  We have scheduled the above named patient for a(n) Colonoscopy procedure. Our records show that (s)he is on anticoagulation therapy.  Please advise as to whether the patient may come off their therapy of xarelto 2 days prior to their procedure which is scheduled for 05/10/20.  Please route your response to Dixon Boos, [place]  Sincerely,    [facility]

## 2020-04-25 NOTE — Telephone Encounter (Signed)
Letter faxed to Cyndi Bender, PA-C

## 2020-04-25 NOTE — Telephone Encounter (Signed)
I have spoken to advise that we have changed her over to [facility] and have sent rx to pharmacy. Also advised I will need her to sign up for mychart so she can get updated instructions.   In addition to this, she indicates that she still sees Cyndi Bender, [place] as well as Dr Lisbeth Ply in [place], [place] at [facility] for her [place]. I have sent a letter to that office for clearance and will await a response.   I will call Ms.Langely back tomorrow to ensure that she has gotten all information need for upcoming appointment.

## 2020-04-26 NOTE — Telephone Encounter (Signed)
Patient indicates she will look over instructions for Clenpiq tonight.

## 2020-04-27 ENCOUNTER — Encounter: Payer: Self-pay | Admitting: Internal Medicine

## 2020-04-27 NOTE — Telephone Encounter (Signed)
I have spoken to patient who indicates that she has looked over clenpiq instructions and does not have any questions regarding them.

## 2020-04-27 NOTE — Telephone Encounter (Signed)
We have received correspondence from Cyndi Bender, PA-C indicating: "ok to hold xarelto x 2 days pre-procedure.  Cyndi Bender, PA-C 04/26/20"  See original correspondence under "media" tab in Epic.  I have called patient to advise to hold xarelto 48 hours prior to her upcoming colonoscopy and she verbalizes understanding of this information.

## 2020-05-09 ENCOUNTER — Other Ambulatory Visit: Payer: Self-pay | Admitting: Internal Medicine

## 2020-05-09 ENCOUNTER — Ambulatory Visit (INDEPENDENT_AMBULATORY_CARE_PROVIDER_SITE_OTHER): Payer: BC Managed Care – PPO

## 2020-05-09 DIAGNOSIS — Z1159 Encounter for screening for other viral diseases: Secondary | ICD-10-CM

## 2020-05-10 LAB — SARS CORONAVIRUS 2 (TAT 6-24 HRS): SARS Coronavirus 2: NEGATIVE

## 2020-05-11 ENCOUNTER — Ambulatory Visit (AMBULATORY_SURGERY_CENTER): Payer: BC Managed Care – PPO | Admitting: Internal Medicine

## 2020-05-11 ENCOUNTER — Other Ambulatory Visit: Payer: Self-pay

## 2020-05-11 ENCOUNTER — Encounter: Payer: Self-pay | Admitting: Internal Medicine

## 2020-05-11 VITALS — BP 105/60 | HR 51 | Temp 97.5°F | Resp 11 | Ht 64.0 in | Wt 174.0 lb

## 2020-05-11 DIAGNOSIS — D124 Benign neoplasm of descending colon: Secondary | ICD-10-CM

## 2020-05-11 DIAGNOSIS — Z800 Family history of malignant neoplasm of digestive organs: Secondary | ICD-10-CM

## 2020-05-11 DIAGNOSIS — Z1211 Encounter for screening for malignant neoplasm of colon: Secondary | ICD-10-CM

## 2020-05-11 MED ORDER — SODIUM CHLORIDE 0.9 % IV SOLN: 500.0000 mL | Freq: Once | INTRAVENOUS | Status: DC

## 2020-05-11 NOTE — Progress Notes (Signed)
Report to PACU, RN, vss, BBS= Clear.  

## 2020-05-11 NOTE — Patient Instructions (Addendum)
Resume Xarelto today at usual dose  Handouts on polyps and diverticulosis given to you today  Await pathology results on polyp removed    YOU HAD AN ENDOSCOPIC PROCEDURE TODAY AT [place]:   Refer to the procedure report that was given to you for any specific questions about what was found during the examination.  If the procedure report does not answer your questions, please call your gastroenterologist to clarify.  If you requested that your care partner not be given the details of your procedure findings, then the procedure report has been included in a sealed envelope for you to review at your convenience later.  YOU SHOULD EXPECT: Some feelings of bloating in the abdomen. Passage of more gas than usual.  Walking can help get rid of the air that was put into your GI tract during the procedure and reduce the bloating. If you had a lower endoscopy (such as a colonoscopy or flexible sigmoidoscopy) you may notice spotting of blood in your stool or on the toilet paper. If you underwent a bowel prep for your procedure, you may not have a normal bowel movement for a few days.  Please Note:  You might notice some irritation and congestion in your nose or some drainage.  This is from the oxygen used during your procedure.  There is no need for concern and it should clear up in a day or so.  SYMPTOMS TO REPORT IMMEDIATELY:   Following lower endoscopy (colonoscopy or flexible sigmoidoscopy):  Excessive amounts of blood in the stool  Significant tenderness or worsening of abdominal pains  Swelling of the abdomen that is new, acute  Fever of 100F or higher    For urgent or emergent issues, a gastroenterologist can be reached at any hour by calling [phone]. Do not use MyChart messaging for urgent concerns.    DIET:  We do recommend a small meal at first, but then you may proceed to your regular diet.  Drink plenty of fluids but you should avoid alcoholic beverages  for 24 hours.  ACTIVITY:  You should plan to take it easy for the rest of today and you should NOT DRIVE or use heavy machinery until tomorrow (because of the sedation medicines used during the test).    FOLLOW UP: Our staff will call the number listed on your records 48-72 hours following your procedure to check on you and address any questions or concerns that you may have regarding the information given to you following your procedure. If we do not reach you, we will leave a message.  We will attempt to reach you two times.  During this call, we will ask if you have developed any symptoms of COVID 19. If you develop any symptoms (ie: fever, flu-like symptoms, shortness of breath, cough etc.) before then, please call [phone].  If you test positive for Covid 19 in the 2 weeks post procedure, please call and report this information to [place].    If any biopsies were taken you will be contacted by phone or by letter within the next 1-3 weeks.  Please call us at [phone] if you have not heard about the biopsies in 3 weeks.    SIGNATURES/CONFIDENTIALITY: You and/or your care partner have signed paperwork which will be entered into your electronic medical record.  These signatures attest to the fact that that the information above on your After Visit Summary has been reviewed and is understood.  Full responsibility of the  confidentiality of this discharge information lies with you and/or your care-partner. 

## 2020-05-11 NOTE — Progress Notes (Signed)
Pt's states no medical or surgical changes since previsit or office visit.  CW - vitals 

## 2020-05-11 NOTE — Op Note (Addendum)
[place] Patient Name: Nancy Simon Procedure Date: 05/11/2020 7:13 AM MRN: [id number] Endoscopist: Jerene Bears , MD Age: 54 Referring MD:  Date of Birth: December 27, 1965 Gender: Female Account #: [id number] Procedure:                Colonoscopy Indications:              Screening in patient at increased risk: Family                            history of 1st-degree relative with colorectal                            cancer, Last colonoscopy: March 2015 Medicines:                Monitored Anesthesia Care Procedure:                Pre-Anesthesia Assessment:                           - Prior to the procedure, a History and Physical                            was performed, and patient medications and                            allergies were reviewed. The patient's tolerance of                            previous anesthesia was also reviewed. The risks                            and benefits of the procedure and the sedation                            options and risks were discussed with the patient.                            All questions were answered, and informed consent                            was obtained. Prior Anticoagulants: The patient has                            taken Xarelto (rivaroxaban), last dose was 2 days                            prior to procedure. ASA Grade Assessment: II - A                            patient with mild systemic disease. After reviewing                            the risks and benefits, the patient was deemed in  satisfactory condition to undergo the procedure.                           After obtaining informed consent, the colonoscope                            was passed under direct vision. Throughout the                            procedure, the patient's blood pressure, pulse, and                            oxygen saturations were monitored continuously. The                            Colonoscope was  introduced through the anus and                            advanced to the terminal ileum. The quality of the                            bowel preparation was good. The terminal ileum,                            ileocecal valve, appendiceal orifice, and rectum                            were photographed. Scope In: 7:45:58 AM Scope Out: 8:04:16 AM Scope Withdrawal Time: 0 hours 14 minutes 16 seconds  Total Procedure Duration: 0 hours 18 minutes 18 seconds  Findings:                 The digital rectal exam was normal.                           The terminal ileum appeared normal.                           A 4 mm polyp was found in the descending colon. The                            polyp was sessile. The polyp was removed with a                            cold snare. Resection and retrieval were complete.                           A few small-mouthed diverticula were found in the                            sigmoid colon and ascending colon.                           The retroflexed view of the distal rectum and anal  verge was normal and showed no anal or rectal                            abnormalities. Complications:            No immediate complications. Estimated Blood Loss:     Estimated blood loss was minimal. Impression:               - The examined portion of the ileum was normal.                           - One 4 mm polyp in the descending colon, removed                            with a cold snare. Resected and retrieved.                           - Diverticulosis (mild) in the sigmoid colon and in                            the ascending colon.                           - The distal rectum and anal verge are normal on                            retroflexion view. Recommendation:           - Patient has a contact number available for                            emergencies. The signs and symptoms of potential                            delayed  complications were discussed with the                            patient. Return to normal activities tomorrow.                            Written discharge instructions were provided to the                            patient.                           - Resume previous diet.                           - Continue present medications.                           - Resume Xarelto today at usual dose.                           - Await pathology results.                           -  Repeat colonoscopy in 5 years. Jerene Bears, MD 05/11/2020 8:09:30 AM This report has been signed electronically.

## 2020-05-11 NOTE — Progress Notes (Signed)
Called to room to assist during endoscopic procedure.  Patient ID and intended procedure confirmed with present staff. Received instructions for my participation in the procedure from the performing physician.  

## 2020-05-15 ENCOUNTER — Telehealth: Payer: Self-pay | Admitting: *Deleted

## 2020-05-15 ENCOUNTER — Encounter: Payer: Self-pay | Admitting: Internal Medicine

## 2020-05-15 NOTE — Telephone Encounter (Signed)
  Follow up Call-  Call back number 05/11/2020  Post procedure Call Back phone  # [phone]  Permission to leave phone message Yes  Some recent data might be hidden     Patient questions:  Do you have a fever, pain , or abdominal swelling? No. Pain Score  0 *  Have you tolerated food without any problems? Yes.    Have you been able to return to your normal activities? Yes.    Do you have any questions about your discharge instructions: Diet   No. Medications  No. Follow up visit  No.  Do you have questions or concerns about your Care? No.  Actions: * If pain score is 4 or above: No action needed, pain <4.  1. Have you developed a fever since your procedure? no  2.   Have you had an respiratory symptoms (SOB or cough) since your procedure? no  3.   Have you tested positive for COVID 19 since your procedure no  4.   Have you had any family members/close contacts diagnosed with the COVID 19 since your procedure? no   If yes to any of these questions please route to Joylene John, RN and Erenest Rasher, RN

## 2020-06-07 ENCOUNTER — Other Ambulatory Visit: Payer: Self-pay | Admitting: Obstetrics and Gynecology

## 2020-06-07 DIAGNOSIS — Z1231 Encounter for screening mammogram for malignant neoplasm of breast: Secondary | ICD-10-CM

## 2020-07-31 ENCOUNTER — Ambulatory Visit
Admission: RE | Admit: 2020-07-31 | Discharge: 2020-07-31 | Disposition: A | Discharge status: Home / Self Care | Payer: BC Managed Care – PPO | Source: Ambulatory Visit | Attending: Obstetrics and Gynecology | Admitting: Obstetrics and Gynecology

## 2020-07-31 ENCOUNTER — Other Ambulatory Visit: Payer: Self-pay

## 2020-07-31 DIAGNOSIS — Z1231 Encounter for screening mammogram for malignant neoplasm of breast: Secondary | ICD-10-CM

## 2020-12-19 IMAGING — MG DIGITAL SCREENING BILAT W/ TOMO W/ CAD
8 series · 8 of 24 positions shown · non-contrast
Comparison: Previous exam(s).

CLINICAL DATA: Screening.

EXAM:
DIGITAL SCREENING BILATERAL MAMMOGRAM WITH TOMO AND CAD

[L MLO synth-2D]
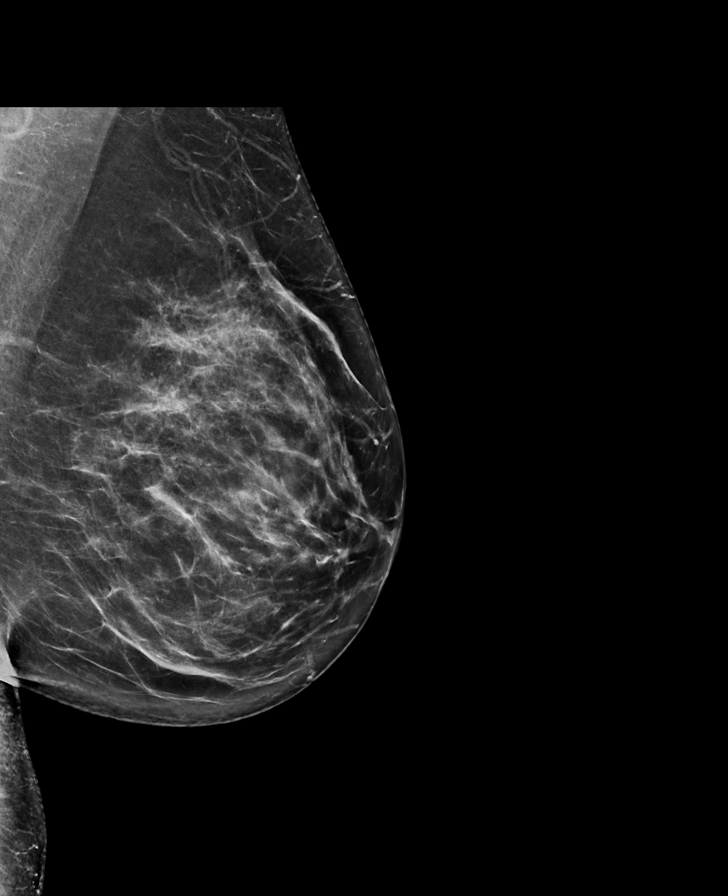

[R CC synth-2D]
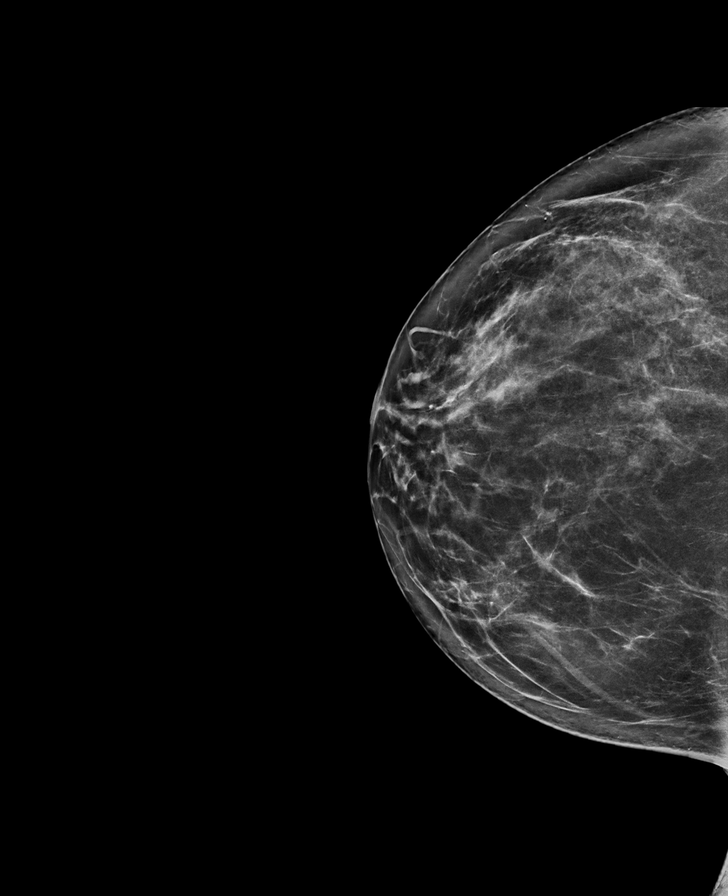

[R MLO synth-2D]
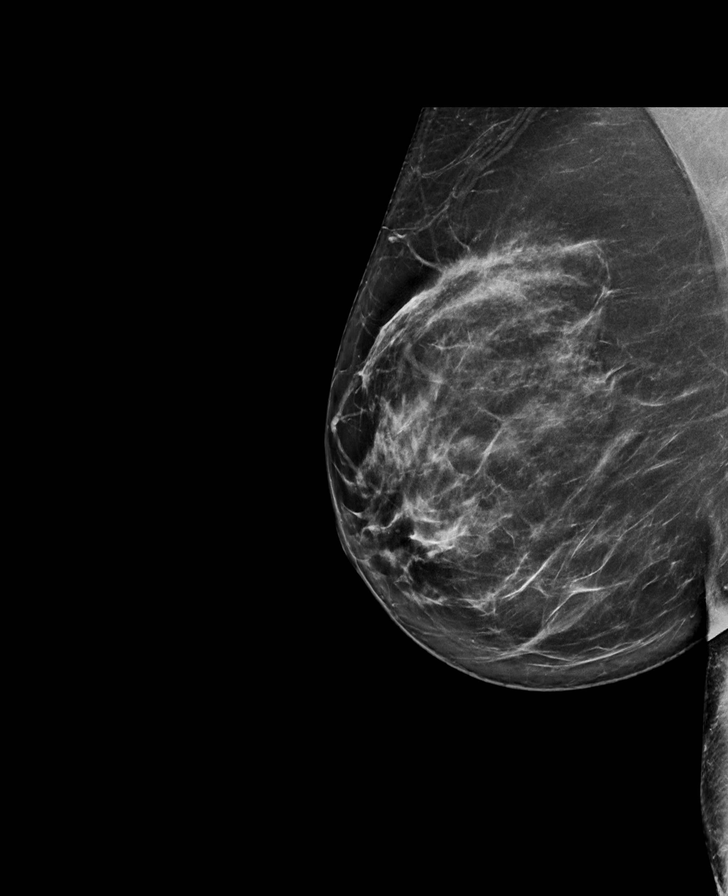

[L CC synth-2D]
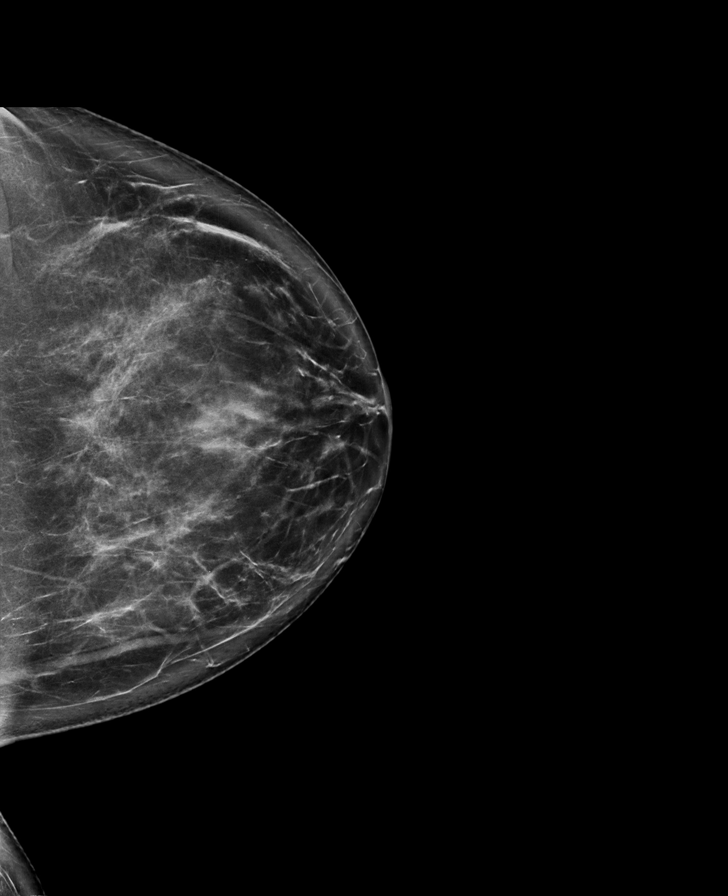

[L CC tomo · tomo slice 45/89.0]
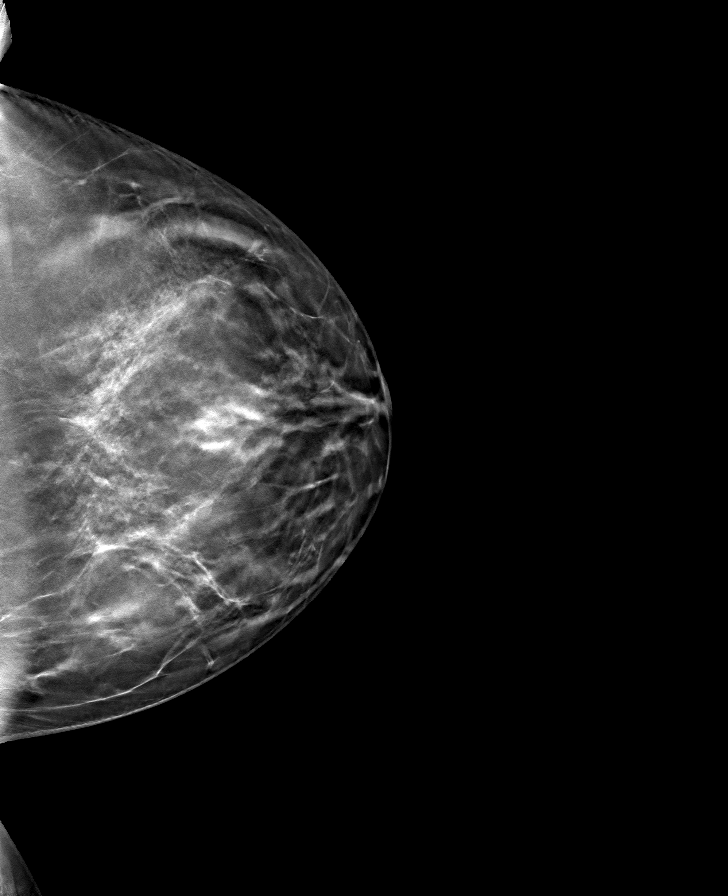

[L MLO tomo · tomo slice 41/80.0]
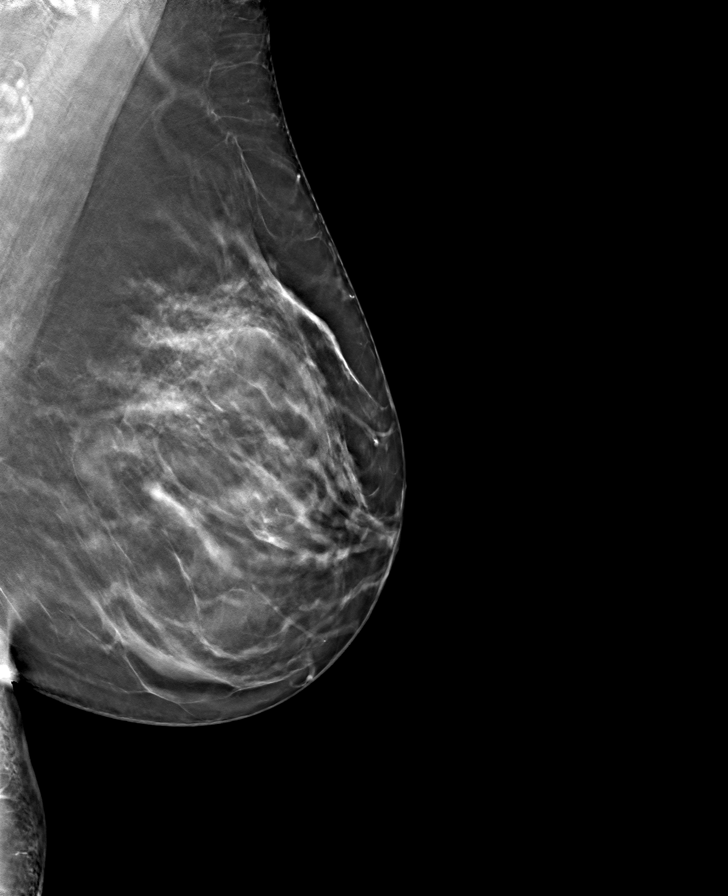

[R MLO tomo · tomo slice 43/85.0]
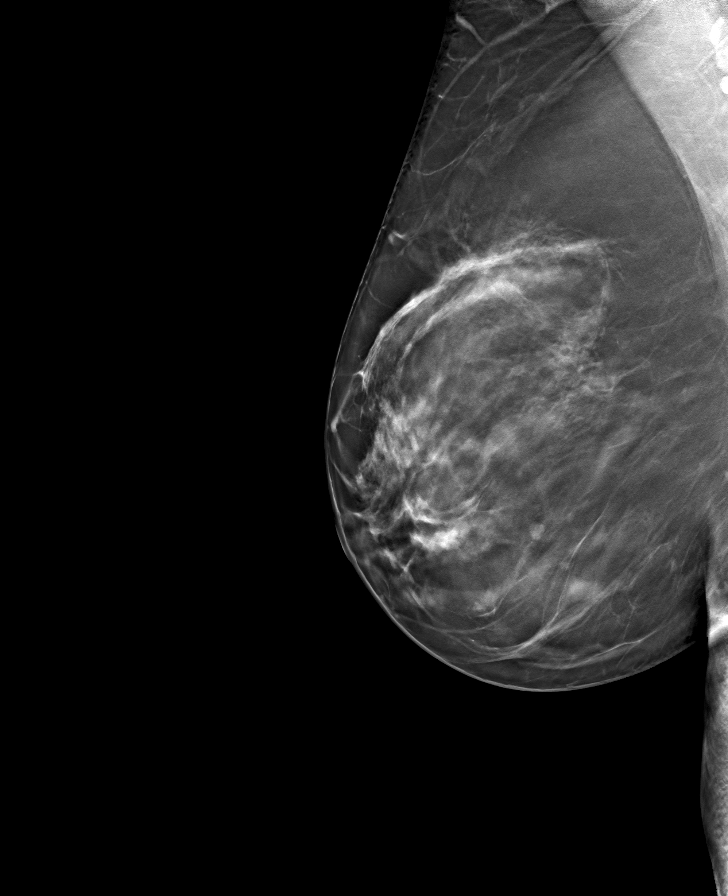

[R CC tomo · tomo slice 39/78.0]
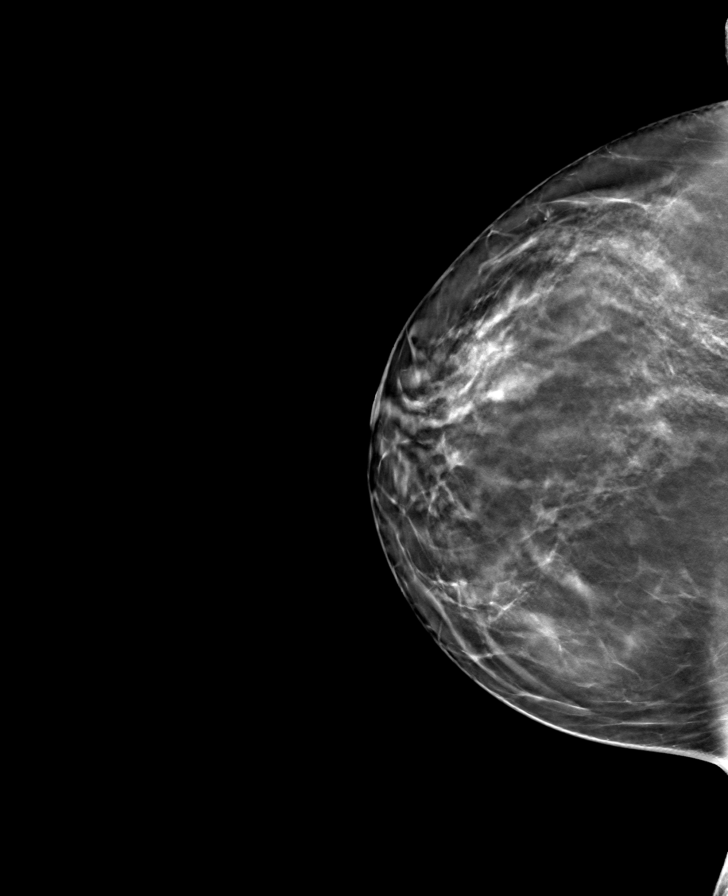

[8 of 24 positions shown; findings below may reference images not displayed]

ACR Breast Density Category c: The breast tissue is heterogeneously
dense, which may obscure small masses.
FINDINGS: There are no findings suspicious for malignancy. Images were
processed with CAD.
IMPRESSION: No mammographic evidence of malignancy. A result letter of this
screening mammogram will be mailed directly to the patient.

RECOMMENDATION:
Screening mammogram in one year. (Code:[id number]-[id number])

BI-RADS CATEGORY  1: Negative.

## 2021-08-14 ENCOUNTER — Other Ambulatory Visit: Payer: Self-pay | Admitting: Obstetrics and Gynecology

## 2021-08-14 DIAGNOSIS — Z1231 Encounter for screening mammogram for malignant neoplasm of breast: Secondary | ICD-10-CM

## 2021-09-10 ENCOUNTER — Ambulatory Visit
Admission: RE | Admit: 2021-09-10 | Discharge: 2021-09-10 | Disposition: A | Discharge status: Home / Self Care | Payer: BC Managed Care – PPO | Source: Ambulatory Visit | Attending: Obstetrics and Gynecology | Admitting: Obstetrics and Gynecology

## 2021-09-10 ENCOUNTER — Other Ambulatory Visit: Payer: Self-pay

## 2021-09-10 DIAGNOSIS — Z1231 Encounter for screening mammogram for malignant neoplasm of breast: Secondary | ICD-10-CM

## 2022-06-13 ENCOUNTER — Other Ambulatory Visit: Payer: Self-pay | Admitting: Obstetrics and Gynecology

## 2022-06-13 DIAGNOSIS — Z1231 Encounter for screening mammogram for malignant neoplasm of breast: Secondary | ICD-10-CM

## 2022-09-12 ENCOUNTER — Ambulatory Visit
Admission: RE | Admit: 2022-09-12 | Discharge: 2022-09-12 | Disposition: A | Discharge status: Home / Self Care | Payer: BC Managed Care – PPO | Source: Ambulatory Visit | Attending: Obstetrics and Gynecology | Admitting: Obstetrics and Gynecology

## 2022-09-12 DIAGNOSIS — Z1231 Encounter for screening mammogram for malignant neoplasm of breast: Secondary | ICD-10-CM

## 2023-09-11 ENCOUNTER — Other Ambulatory Visit: Payer: Self-pay | Admitting: Obstetrics and Gynecology

## 2023-09-11 DIAGNOSIS — Z0000 Encounter for general adult medical examination without abnormal findings: Secondary | ICD-10-CM

## 2023-09-16 ENCOUNTER — Ambulatory Visit: Payer: BC Managed Care – PPO

## 2023-10-09 ENCOUNTER — Ambulatory Visit
Admission: RE | Admit: 2023-10-09 | Discharge: 2023-10-09 | Disposition: A | Discharge status: Home / Self Care | Payer: BC Managed Care – PPO | Source: Ambulatory Visit | Attending: Obstetrics and Gynecology | Admitting: Obstetrics and Gynecology

## 2023-10-09 DIAGNOSIS — Z0000 Encounter for general adult medical examination without abnormal findings: Secondary | ICD-10-CM

## 2024-02-23 ENCOUNTER — Ambulatory Visit: Admitting: Podiatry

## 2024-09-15 ENCOUNTER — Other Ambulatory Visit: Payer: Self-pay | Admitting: Obstetrics and Gynecology

## 2024-09-15 DIAGNOSIS — Z1231 Encounter for screening mammogram for malignant neoplasm of breast: Secondary | ICD-10-CM

## 2024-10-11 ENCOUNTER — Ambulatory Visit

## 2024-11-05 ENCOUNTER — Ambulatory Visit
Admission: RE | Admit: 2024-11-05 | Discharge: 2024-11-05 | Disposition: A | Discharge status: Home / Self Care | Source: Ambulatory Visit | Attending: Obstetrics and Gynecology | Admitting: Obstetrics and Gynecology

## 2024-11-05 DIAGNOSIS — Z1231 Encounter for screening mammogram for malignant neoplasm of breast: Secondary | ICD-10-CM
